# Patient Record
Sex: Female | Born: 1970 | Race: White | Hispanic: No | Marital: Married | Smoking: Never smoker
Health system: Southern US, Community
[De-identification: ages and names within clinical notes are randomized; demographics above are authoritative.]

## PROBLEM LIST (undated history)

## (undated) DIAGNOSIS — T7840XA Allergy, unspecified, initial encounter: Secondary | ICD-10-CM

## (undated) DIAGNOSIS — C73 Malignant neoplasm of thyroid gland: Secondary | ICD-10-CM

## (undated) HISTORY — DX: Malignant neoplasm of thyroid gland: C73

## (undated) HISTORY — PX: TONSILLECTOMY: SUR1361

## (undated) HISTORY — PX: CYST EXCISION: SHX5701

## (undated) HISTORY — DX: Allergy, unspecified, initial encounter: T78.40XA

---

## 2002-05-18 DIAGNOSIS — C73 Malignant neoplasm of thyroid gland: Secondary | ICD-10-CM

## 2002-05-18 HISTORY — PX: THYROIDECTOMY: SHX17

## 2002-05-18 HISTORY — DX: Malignant neoplasm of thyroid gland: C73

## 2005-04-14 ENCOUNTER — Other Ambulatory Visit: Admission: RE | Admit: 2005-04-14 | Discharge: 2005-04-14 | Payer: Self-pay | Admitting: Obstetrics and Gynecology

## 2006-01-21 ENCOUNTER — Other Ambulatory Visit: Admission: RE | Admit: 2006-01-21 | Discharge: 2006-01-21 | Payer: Self-pay | Admitting: Obstetrics and Gynecology

## 2006-03-08 ENCOUNTER — Encounter (HOSPITAL_COMMUNITY): Admission: RE | Admit: 2006-03-08 | Discharge: 2006-03-12 | Payer: Self-pay | Admitting: Internal Medicine

## 2006-12-20 ENCOUNTER — Emergency Department (HOSPITAL_COMMUNITY): Admission: EM | Admit: 2006-12-20 | Discharge: 2006-12-20 | Payer: Self-pay | Admitting: Emergency Medicine

## 2007-01-25 ENCOUNTER — Other Ambulatory Visit: Admission: RE | Admit: 2007-01-25 | Discharge: 2007-01-25 | Payer: Self-pay | Admitting: Obstetrics and Gynecology

## 2007-03-14 ENCOUNTER — Encounter (HOSPITAL_COMMUNITY): Admission: RE | Admit: 2007-03-14 | Discharge: 2007-03-18 | Payer: Self-pay | Admitting: Endocrinology

## 2007-04-01 ENCOUNTER — Ambulatory Visit (HOSPITAL_COMMUNITY): Admission: RE | Admit: 2007-04-01 | Discharge: 2007-04-01 | Payer: Self-pay | Admitting: General Surgery

## 2007-04-21 ENCOUNTER — Ambulatory Visit (HOSPITAL_COMMUNITY): Admission: RE | Admit: 2007-04-21 | Discharge: 2007-04-21 | Payer: Self-pay | Admitting: Endocrinology

## 2007-09-19 ENCOUNTER — Encounter (HOSPITAL_COMMUNITY): Admission: RE | Admit: 2007-09-19 | Discharge: 2007-09-23 | Payer: Self-pay | Admitting: Endocrinology

## 2008-02-02 ENCOUNTER — Other Ambulatory Visit: Admission: RE | Admit: 2008-02-02 | Discharge: 2008-02-02 | Payer: Self-pay | Admitting: Obstetrics and Gynecology

## 2008-08-06 ENCOUNTER — Encounter (HOSPITAL_COMMUNITY): Admission: RE | Admit: 2008-08-06 | Discharge: 2008-09-21 | Payer: Self-pay | Admitting: Endocrinology

## 2009-02-14 ENCOUNTER — Other Ambulatory Visit: Admission: RE | Admit: 2009-02-14 | Discharge: 2009-02-14 | Payer: Self-pay | Admitting: Obstetrics and Gynecology

## 2009-04-12 ENCOUNTER — Encounter: Admission: RE | Admit: 2009-04-12 | Discharge: 2009-04-12 | Payer: Self-pay | Admitting: Obstetrics and Gynecology

## 2009-10-15 ENCOUNTER — Ambulatory Visit (HOSPITAL_COMMUNITY): Admission: RE | Admit: 2009-10-15 | Discharge: 2009-10-15 | Payer: Self-pay | Admitting: Endocrinology

## 2009-10-22 ENCOUNTER — Emergency Department (HOSPITAL_COMMUNITY): Admission: EM | Admit: 2009-10-22 | Discharge: 2009-10-22 | Payer: Self-pay | Admitting: Family Medicine

## 2009-11-08 ENCOUNTER — Ambulatory Visit (HOSPITAL_COMMUNITY): Admission: RE | Admit: 2009-11-08 | Discharge: 2009-11-08 | Payer: Self-pay | Admitting: Endocrinology

## 2010-02-20 ENCOUNTER — Other Ambulatory Visit: Admission: RE | Admit: 2010-02-20 | Discharge: 2010-02-20 | Payer: Self-pay | Admitting: Obstetrics and Gynecology

## 2010-05-26 ENCOUNTER — Ambulatory Visit
Admission: RE | Admit: 2010-05-26 | Discharge: 2010-05-26 | Payer: Self-pay | Source: Home / Self Care | Attending: Family Medicine | Admitting: Family Medicine

## 2010-05-26 DIAGNOSIS — J019 Acute sinusitis, unspecified: Secondary | ICD-10-CM | POA: Insufficient documentation

## 2010-05-26 DIAGNOSIS — J309 Allergic rhinitis, unspecified: Secondary | ICD-10-CM | POA: Insufficient documentation

## 2010-06-07 ENCOUNTER — Encounter: Payer: Self-pay | Admitting: Internal Medicine

## 2010-06-08 ENCOUNTER — Encounter: Payer: Self-pay | Admitting: Endocrinology

## 2010-06-19 NOTE — Assessment & Plan Note (Signed)
Summary: [facility] employee/kh   Vital Signs:  Patient profile:   40 year old female Height:      64 inches Weight:      135 pounds BMI:     23.26 Temp:     98.7 degrees F oral Pulse rate:   97 / minute Pulse rhythm:   regular BP sitting:   142 / 95  (left arm) Cuff size:   regular  Vitals Entered By: Luretha Murphy NP (May 26, 2010 11:05 AM) CC: new pt Is Patient Diabetic? No Comments chest cold x 2 weeks   CC:  new pt.  History of Present Illness: Christy [name] comes in with 2 weeks of congestion and relentless cough.  She has been out of work, working from home, and spent several days lying on the couch.  She describes a typical ILI with aches, no fever, that followed with congestion.  She seemed to be getting better, her sinuses were draining well and over the past few days has felt a second sickening.  Her sinuses are no longer draining and she can feel the secreations rolling around.  She is coughing quite a bit, especially at night. When she brings them out they are thick and dark green.  She has been using OTC products.    She has a past history of allergic rhinitis as use to use a nasal steroid, she self discontinued some months ago as she moved to [facility] and had not set up a primary care MD.  She is followed by endo for thyroid CA, S/P treatment and on replacement, her endo keeps her a little hyperthyroid so she has worried about using OTC meds.  She is also followed by a GYN.  Habits & Providers  Alcohol-Tobacco-Diet     Tobacco Status: never  Exercise-Depression-Behavior     Have you felt down or hopeless? no     Have you felt little pleasure in things? no     Depression Counseling: not indicated; screening negative for depression     Seat Belt Use: always  Current Medications (verified): 1)  Amoxicillin 500 Mg Caps (Amoxicillin) .... One Three Times A Day For 7 Days 2)  Tussionex Pennkinetic Er 10-8 Mg/56ml Lqcr (Hydrocod Polst-Chlorphen Polst) .... One Teaspoonful Q 12  Hours, 100cc 3)  Flonase 50 Mcg/act Susp (Fluticasone Propionate) .... Two Squirts in Each Nostril Daily  Allergies (verified): No Known Drug Allergies  Past History:  Past Medical History: Thyroid cancer  Past Surgical History: Throidectomy  Social History: Smoking Status:  never Risk analyst Use:  always  Physical Exam  General:  Well-developed,well-nourished,in no acute distress; alert,appropriate and cooperative throughout examination Ears:  TMs retracted Nose:  good air movement Mouth:  + post nasal drainage Lungs:  Normal respiratory effort, chest expands symmetrically. Lungs are clear to auscultation, no crackles or wheezes. Heart:  rate 97 and regular rhythm.   Skin:  no rashes   Impression & Recommendations:  Problem # 1:  SINUSITIS, ACUTE (ICD-461.9) Treat for secondary infection, cough suppression at night with narcotic cough syrup, add nasal spray both saline and steroid Her updated medication list for this problem includes:    Amoxicillin 500 Mg Caps (Amoxicillin) ..... One three times a day for 7 days    Tussionex Pennkinetic Er 10-8 Mg/13ml Lqcr (Hydrocod polst-chlorphen polst) ..... One teaspoonful q 12 hours, 100cc    Flonase 50 Mcg/act Susp (Fluticasone propionate) .Marland Kitchen..Marland Kitchen Two squirts in each nostril daily  Orders: [facility]- Est Level  3 ([address]109)  Problem # 2:  ALLERGIC RHINITIS (ICD-477.9) recommend continueing beyond the acute period, she can return to see me or another provider at any time. Her updated medication list for this problem includes:    Flonase 50 Mcg/act Susp (Fluticasone propionate) .Marland Kitchen..Marland Kitchen Two squirts in each nostril daily  Orders: [facility]- Est Level  3 ([address]454)  Complete Medication List: 1)  Amoxicillin 500 Mg Caps (Amoxicillin) .... One three times a day for 7 days 2)  Tussionex Pennkinetic Er 10-8 Mg/61ml Lqcr (Hydrocod polst-chlorphen polst) .... One teaspoonful q 12 hours, 100cc 3)  Flonase 50 Mcg/act Susp (Fluticasone propionate) .... Two  squirts in each nostril daily  Patient Instructions: 1)  Fluids 2)  Nasal saline 3)  Antibiotics 4)  Narcotic cough supp at night 5)  nasal steroids Prescriptions: FLONASE 50 MCG/ACT SUSP (FLUTICASONE PROPIONATE) two squirts in each nostril daily  #1 x 11   Entered and Authorized by:   Luretha Murphy NP   Signed by:   Luretha Murphy NP on 05/26/2010   Method used:   Electronically to        [facility]* (retail)       [address]       [address]mailing       [place], [place]  [address]       Ph: [phone]       Fax: [phone]   RxID:   [id number] Sandria Senter ER 10-8 MG/5ML LQCR (HYDROCOD POLST-CHLORPHEN POLST) one teaspoonful q 12 hours, 100cc  #1 x 0   Entered and Authorized by:   Luretha Murphy NP   Signed by:   Luretha Murphy NP on 05/26/2010   Method used:   Print then Give to Patient   RxID:   [id number] AMOXICILLIN 500 MG CAPS (AMOXICILLIN) one three times a day for 7 days  #21 x 0   Entered and Authorized by:   Luretha Murphy NP   Signed by:   Luretha Murphy NP on 05/26/2010   Method used:   Electronically to        [facility]* (retail)       [address]       [address]mail[address]       [place], [place]  [address]       Ph: [phone]       Fax: [phone]   RxID:   [id number]    Orders Added: 1)  [facility]- Est Level  3 [[address]27]

## 2010-08-03 LAB — CBC
Platelets: 253 10*3/uL (ref 150–400)
RDW: 12.4 % (ref 11.5–15.5)
WBC: 8.8 10*3/uL (ref 4.0–10.5)

## 2010-08-03 LAB — APTT: aPTT: 29 s (ref 24–37)

## 2010-08-03 LAB — PROTIME-INR: INR: 0.99 (ref 0.00–1.49)

## 2010-08-19 ENCOUNTER — Emergency Department (HOSPITAL_BASED_OUTPATIENT_CLINIC_OR_DEPARTMENT_OTHER)
Admission: EM | Admit: 2010-08-19 | Discharge: 2010-08-19 | Disposition: A | Discharge status: Home / Self Care | Payer: 59 | Attending: Emergency Medicine | Admitting: Emergency Medicine

## 2010-08-19 ENCOUNTER — Emergency Department (INDEPENDENT_AMBULATORY_CARE_PROVIDER_SITE_OTHER): Payer: 59

## 2010-08-19 DIAGNOSIS — W292XXA Contact with other powered household machinery, initial encounter: Secondary | ICD-10-CM | POA: Insufficient documentation

## 2010-08-19 DIAGNOSIS — S61209A Unspecified open wound of unspecified finger without damage to nail, initial encounter: Secondary | ICD-10-CM | POA: Insufficient documentation

## 2010-08-19 DIAGNOSIS — Y92009 Unspecified place in unspecified non-institutional (private) residence as the place of occurrence of the external cause: Secondary | ICD-10-CM | POA: Insufficient documentation

## 2010-09-05 ENCOUNTER — Other Ambulatory Visit (HOSPITAL_COMMUNITY): Payer: Self-pay | Admitting: Endocrinology

## 2010-09-05 DIAGNOSIS — T17308A Unspecified foreign body in larynx causing other injury, initial encounter: Secondary | ICD-10-CM

## 2010-09-05 DIAGNOSIS — C73 Malignant neoplasm of thyroid gland: Secondary | ICD-10-CM

## 2010-09-10 ENCOUNTER — Other Ambulatory Visit (HOSPITAL_COMMUNITY): Payer: Self-pay | Admitting: Endocrinology

## 2010-09-10 DIAGNOSIS — C73 Malignant neoplasm of thyroid gland: Secondary | ICD-10-CM

## 2010-09-11 ENCOUNTER — Ambulatory Visit (HOSPITAL_COMMUNITY)
Admission: RE | Admit: 2010-09-11 | Discharge: 2010-09-11 | Disposition: A | Discharge status: Home / Self Care | Payer: 59 | Source: Ambulatory Visit | Attending: Endocrinology | Admitting: Endocrinology

## 2010-09-11 DIAGNOSIS — C73 Malignant neoplasm of thyroid gland: Secondary | ICD-10-CM

## 2010-09-11 DIAGNOSIS — J3800 Paralysis of vocal cords and larynx, unspecified: Secondary | ICD-10-CM | POA: Insufficient documentation

## 2010-09-11 DIAGNOSIS — R6889 Other general symptoms and signs: Secondary | ICD-10-CM | POA: Insufficient documentation

## 2010-09-11 DIAGNOSIS — T17308A Unspecified foreign body in larynx causing other injury, initial encounter: Secondary | ICD-10-CM

## 2010-09-11 DIAGNOSIS — R059 Cough, unspecified: Secondary | ICD-10-CM | POA: Insufficient documentation

## 2010-09-11 DIAGNOSIS — R05 Cough: Secondary | ICD-10-CM | POA: Insufficient documentation

## 2010-09-22 ENCOUNTER — Encounter (HOSPITAL_COMMUNITY)
Admission: RE | Admit: 2010-09-22 | Discharge: 2010-09-22 | Disposition: A | Discharge status: Home / Self Care | Payer: 59 | Source: Ambulatory Visit | Attending: Endocrinology | Admitting: Endocrinology

## 2010-09-22 DIAGNOSIS — C73 Malignant neoplasm of thyroid gland: Secondary | ICD-10-CM | POA: Insufficient documentation

## 2010-09-23 ENCOUNTER — Encounter (HOSPITAL_COMMUNITY)
Admission: RE | Admit: 2010-09-23 | Discharge: 2010-09-23 | Disposition: A | Discharge status: Home / Self Care | Payer: 59 | Source: Ambulatory Visit | Attending: Endocrinology | Admitting: Endocrinology

## 2010-09-24 ENCOUNTER — Encounter (HOSPITAL_COMMUNITY)
Admission: RE | Admit: 2010-09-24 | Discharge: 2010-09-24 | Disposition: A | Discharge status: Home / Self Care | Payer: 59 | Source: Ambulatory Visit | Attending: Endocrinology | Admitting: Endocrinology

## 2010-09-24 DIAGNOSIS — C73 Malignant neoplasm of thyroid gland: Secondary | ICD-10-CM | POA: Insufficient documentation

## 2010-09-26 ENCOUNTER — Ambulatory Visit (HOSPITAL_COMMUNITY)
Admission: RE | Admit: 2010-09-26 | Discharge: 2010-09-26 | Disposition: A | Discharge status: Home / Self Care | Payer: 59 | Source: Ambulatory Visit | Attending: Endocrinology | Admitting: Endocrinology

## 2010-09-26 DIAGNOSIS — C73 Malignant neoplasm of thyroid gland: Secondary | ICD-10-CM | POA: Insufficient documentation

## 2010-09-26 MED ORDER — SODIUM IODIDE I 131 CAPSULE
4.0000 | Freq: Once | INTRAVENOUS | Status: AC | PRN
  Administered 2010-09-26: 4 via ORAL

## 2011-08-03 ENCOUNTER — Other Ambulatory Visit: Payer: Self-pay | Admitting: Obstetrics and Gynecology

## 2011-08-03 DIAGNOSIS — Z1231 Encounter for screening mammogram for malignant neoplasm of breast: Secondary | ICD-10-CM

## 2011-08-06 ENCOUNTER — Ambulatory Visit
Admission: RE | Admit: 2011-08-06 | Discharge: 2011-08-06 | Disposition: A | Discharge status: Home / Self Care | Payer: 59 | Source: Ambulatory Visit | Attending: Obstetrics and Gynecology | Admitting: Obstetrics and Gynecology

## 2011-08-06 DIAGNOSIS — Z1231 Encounter for screening mammogram for malignant neoplasm of breast: Secondary | ICD-10-CM

## 2012-04-19 ENCOUNTER — Encounter: Payer: Self-pay | Admitting: Family Medicine

## 2012-04-19 ENCOUNTER — Ambulatory Visit (INDEPENDENT_AMBULATORY_CARE_PROVIDER_SITE_OTHER): Payer: 59 | Admitting: Family Medicine

## 2012-04-19 VITALS — BP 138/86 | HR 94 | Temp 98.6°F | Ht 64.0 in | Wt 137.0 lb

## 2012-04-19 DIAGNOSIS — J189 Pneumonia, unspecified organism: Secondary | ICD-10-CM

## 2012-04-19 MED ORDER — AZITHROMYCIN 500 MG PO TABS: 500.0000 mg | ORAL_TABLET | Freq: Every day | ORAL | Status: AC

## 2012-04-19 MED ORDER — BENZONATATE 200 MG PO CAPS: 200.0000 mg | ORAL_CAPSULE | Freq: Two times a day (BID) | ORAL | Status: DC | PRN

## 2012-04-19 NOTE — Assessment & Plan Note (Signed)
For your symptoms: Most likely viral in nature, but I am concerned for a bacterial component, atypical.   For this: 1. Azithromycin 500 mg daily for 5 days 2. Tessalon for cough 3. Continue mucinex, honey and plenty of fluids.   F/u for fever, worsening shortness of breath and new onset chest pain.  If patient is not better will obtain CXR and consider broadening antibiotics.

## 2012-04-19 NOTE — Patient Instructions (Addendum)
Christy [name],   Thank you for coming in today.   For your symptoms: Most likely viral in nature, but I am concerned for a bacterial component.   For this: 1. Azithromycin 500 mg daily for 5 days 2. Tessalon for cough 3. Continue mucinex, honey and plenty of fluids.   F/u for fever, worsening shortness of breath and new onset chest pain.   Dr. Armen Pickup

## 2012-04-19 NOTE — Progress Notes (Signed)
Subjective:     Patient ID: Christy [name], female   DOB: 07-31-1970, 41 y.o.   MRN: [id number]  HPI 41 yo F non-smoker presents for same day visit with 10 days of cough and fatigue. Cough was initially non-productive but is now productive. Additionally she experienced scratchy throat, loss of voice, nasal congestion and fatigue. She reports that cough and lack of sleep are the worse symptoms. She denies fever and chest pain. She has SOB after coughing fits. Sick contacts at work. Husband is not sick.   Review of Systems As per HPI     Objective:   Physical Exam BP 138/86  Pulse 94  Temp 98.6 F (37 C) (Oral)  Ht 5\' 4"  (1.626 m)  Wt 137 lb (62.143 kg)  BMI 23.52 kg/m2  SpO2 97% General appearance: alert, cooperative and no distress Eyes: conjunctivae/corneas clear. PERRL, EOM's intact.  Nose: moderate congestion, turbinates pink, swollen Throat: lips, mucosa, and tongue normal; teeth and gums normal Neck: no adenopathy, no carotid bruit, no JVD, supple, symmetrical, trachea midline and thyroid absent with healed surgical scar.  Lungs: clear to auscultation bilaterally Heart: regular rate and rhythm, S1, S2 normal, no murmur, click, rub or gallop Pulses: 2+ and symmetric Neurologic: Grossly normal     Assessment and Plan:

## 2012-04-20 ENCOUNTER — Encounter: Payer: Self-pay | Admitting: Family Medicine

## 2012-04-20 ENCOUNTER — Telehealth: Payer: Self-pay | Admitting: Family Medicine

## 2012-04-20 NOTE — Telephone Encounter (Signed)
Tessalon pearls not working - needs something else call in today if possible - she was up all night coughing,.

## 2012-04-20 NOTE — Telephone Encounter (Signed)
Will forward message to Dr. Armen Pickup. Patient notified that it will be tomorrow  .

## 2012-04-21 MED ORDER — HYDROCODONE-HOMATROPINE 5-1.5 MG/5ML PO SYRP: 5.0000 mL | ORAL_SOLUTION | Freq: Three times a day (TID) | ORAL | Status: DC | PRN

## 2012-04-21 NOTE — Telephone Encounter (Signed)
I have  paged Dr. Armen Pickup twice today but she is unavailable. Will send message to preceptor for advise.

## 2012-04-21 NOTE — Telephone Encounter (Signed)
Patient is calling because she really would like some cough medication.  She hasn't slept in several days and has now pulled a muscle in her neck from her coughing.  She uses [facility].  She said if she isn't at her desk, it is ok to leave a message.

## 2012-04-21 NOTE — Telephone Encounter (Signed)
Rx called in to [facility]. and patient notified.

## 2012-06-15 ENCOUNTER — Ambulatory Visit: Payer: 59 | Admitting: Family Medicine

## 2012-06-17 ENCOUNTER — Encounter: Payer: Self-pay | Admitting: Family Medicine

## 2012-06-17 ENCOUNTER — Ambulatory Visit (INDEPENDENT_AMBULATORY_CARE_PROVIDER_SITE_OTHER): Payer: 59 | Admitting: Family Medicine

## 2012-06-17 VITALS — BP 129/84 | HR 96 | Temp 98.8°F | Ht 64.0 in | Wt 140.0 lb

## 2012-06-17 DIAGNOSIS — Z23 Encounter for immunization: Secondary | ICD-10-CM

## 2012-06-17 DIAGNOSIS — J309 Allergic rhinitis, unspecified: Secondary | ICD-10-CM

## 2012-06-17 MED ORDER — CETIRIZINE HCL 10 MG PO TABS: 10.0000 mg | ORAL_TABLET | Freq: Every day | ORAL | Status: AC

## 2012-06-17 MED ORDER — FLUTICASONE PROPIONATE 50 MCG/ACT NA SUSP: 2.0000 | Freq: Every day | NASAL | Status: DC

## 2012-06-17 NOTE — Progress Notes (Signed)
  Subjective:    Patient ID: Christy [name], female    DOB: [birth year], 42 y.o.   MRN: [id number]  HPI Comments: Has long h/o allergies, but does not remember to take her meds as directed.  Has new dog at home x 1 year and she is getting worse with this.  She uses Claritin and Benadryl sometimes.  Sinus Problem This is a chronic problem. The current episode started more than 1 year ago. The problem has been gradually worsening since onset. There has been no fever. Associated symptoms include congestion, sinus pressure and sneezing. Pertinent negatives include no chills, coughing, shortness of breath, sore throat or swollen glands. Treatments tried: anti-histamines prn. The treatment provided mild relief.      Review of Systems  Constitutional: Negative for chills.  HENT: Positive for congestion, sneezing and sinus pressure. Negative for sore throat.   Respiratory: Negative for cough and shortness of breath.        Objective:   Physical Exam  Vitals reviewed. Constitutional: She appears well-developed and well-nourished.  HENT:  Head: Normocephalic and atraumatic.  Right Ear: Tympanic membrane normal.  Left Ear: Tympanic membrane normal.  Nose: Mucosal edema and rhinorrhea present. No nose lacerations or sinus tenderness. Right sinus exhibits no frontal sinus tenderness. Left sinus exhibits no frontal sinus tenderness.  Mouth/Throat: Oropharynx is clear and moist.  Neck: Neck supple.  Cardiovascular: Normal rate and regular rhythm.   No murmur heard. Pulmonary/Chest: Effort normal and breath sounds normal.  Abdominal: Soft. There is no tenderness. There is no guarding.  Lymphadenopathy:    She has no cervical adenopathy.          Assessment & Plan:  TDaP today Health screen at work and flu shot there in 10/13.

## 2012-06-17 NOTE — Assessment & Plan Note (Signed)
Trial of steroid nasal spray and Zyrtec.  Needs to try to take daily.

## 2012-06-17 NOTE — Patient Instructions (Addendum)
Allergic Rhinitis  Allergic rhinitis is when the mucous membranes in the nose respond to allergens. Allergens are particles in the air that cause your body to have an allergic reaction. This causes you to release allergic antibodies. Through a chain of events, these eventually cause you to release histamine into the blood stream (hence the use of antihistamines). Although meant to be protective to the body, it is this release that causes your discomfort, such as frequent sneezing, congestion and an itchy runny nose.    CAUSES    The pollen allergens may come from grasses, trees, and weeds. This is seasonal allergic rhinitis, or "hay fever." Other allergens cause year-round allergic rhinitis (perennial allergic rhinitis) such as house dust mite allergen, pet dander and mold spores.    SYMPTOMS     Nasal stuffiness (congestion).   Runny, itchy nose with sneezing and tearing of the eyes.   There is often an itching of the mouth, eyes and ears.  It cannot be cured, but it can be controlled with medications.  DIAGNOSIS    If you are unable to determine the offending allergen, skin or blood testing may find it.  TREATMENT     Avoid the allergen.   Medications and allergy shots (immunotherapy) can help.   Hay fever may often be treated with antihistamines in pill or nasal spray forms. Antihistamines block the effects of histamine. There are over-the-counter medicines that may help with nasal congestion and swelling around the eyes. Check with your caregiver before taking or giving this medicine.  If the treatment above does not work, there are many new medications your caregiver can prescribe. Stronger medications may be used if initial measures are ineffective. Desensitizing injections can be used if medications and avoidance fails. Desensitization is when a patient is given ongoing shots until the body becomes less sensitive to the allergen. Make sure you follow up with your caregiver if problems continue.   SEEK MEDICAL CARE IF:     You develop fever (more than 100.5 F (38.1 C).   You develop a cough that does not stop easily (persistent).   You have shortness of breath.   You start wheezing.   Symptoms interfere with normal daily activities.  Document Released: 01/27/2001 Document Revised: 07/27/2011 Document Reviewed: 08/08/2008  ExitCare Patient Information 2013 [employer].

## 2012-07-02 ENCOUNTER — Other Ambulatory Visit: Payer: Self-pay

## 2012-09-16 ENCOUNTER — Other Ambulatory Visit (HOSPITAL_COMMUNITY): Payer: Self-pay | Admitting: Endocrinology

## 2012-09-16 DIAGNOSIS — C73 Malignant neoplasm of thyroid gland: Secondary | ICD-10-CM

## 2012-09-29 ENCOUNTER — Ambulatory Visit (INDEPENDENT_AMBULATORY_CARE_PROVIDER_SITE_OTHER): Payer: 59 | Admitting: Sports Medicine

## 2012-09-29 ENCOUNTER — Encounter: Payer: Self-pay | Admitting: Sports Medicine

## 2012-09-29 VITALS — BP 133/98 | HR 82 | Ht 64.0 in | Wt 140.0 lb

## 2012-09-29 DIAGNOSIS — M7662 Achilles tendinitis, left leg: Secondary | ICD-10-CM

## 2012-09-29 DIAGNOSIS — M7660 Achilles tendinitis, unspecified leg: Secondary | ICD-10-CM

## 2012-09-29 NOTE — Patient Instructions (Signed)
Try heel lifts in your shoes to take pressure of  Please do suggested exercises daily

## 2012-09-29 NOTE — Progress Notes (Signed)
  Subjective:    Patient ID: Christy [name], female    DOB: 02/11/1971, 42 y.o.   MRN: [id number]  HPI  Lt AT pain since yesterday, stepped out of car felt pain immediately. Has had aching since that time, more painful with weight bearing. On synthroid for suppression therapy s/p thyroid CA x 10 years. Went to [place] last week, did walking on hard sand on beach and some biking.   No history of use of Cipro  She walks her dog regularly but is not in an exercise program   Review of Systems     Objective:   Physical Exam  Physical exam:  Rt AT: Non tender Normal ROM Slight increase in inversion laxity on rt  Lt AT: Tenderness deep to achilles in bursal area Calf non tender Normal ROM  Slight subluxation of 5th MTP bilat Flattening of trans arch bilat, otherwise normal  Rt SI joint does not move freely Pseudo leg length difference that corrects with sitting up  MSK ultrasound Left Achilles tendon is intact in all the fibers appear normal The left retrocalcaneal bursa is hypoechoic and somewhat swollen This is seen on both longitudinal and transverse view No significant increase in Doppler flow       Assessment & Plan:

## 2012-09-29 NOTE — Assessment & Plan Note (Signed)
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

## 2012-10-03 ENCOUNTER — Encounter (HOSPITAL_COMMUNITY)
Admission: RE | Admit: 2012-10-03 | Discharge: 2012-10-03 | Disposition: A | Discharge status: Home / Self Care | Payer: 59 | Source: Ambulatory Visit | Attending: Endocrinology | Admitting: Endocrinology

## 2012-10-03 DIAGNOSIS — C73 Malignant neoplasm of thyroid gland: Secondary | ICD-10-CM | POA: Insufficient documentation

## 2012-10-03 DIAGNOSIS — E0789 Other specified disorders of thyroid: Secondary | ICD-10-CM | POA: Insufficient documentation

## 2012-10-03 MED ORDER — THYROTROPIN ALFA 1.1 MG IM SOLR
0.9000 mg | INTRAMUSCULAR | Status: AC
  Administered 2012-10-03: 0.9 mg via INTRAMUSCULAR

## 2012-10-04 ENCOUNTER — Encounter (HOSPITAL_COMMUNITY)
Admission: RE | Admit: 2012-10-04 | Discharge: 2012-10-04 | Disposition: A | Discharge status: Home / Self Care | Payer: 59 | Source: Ambulatory Visit | Attending: Endocrinology | Admitting: Endocrinology

## 2012-10-04 MED ORDER — THYROTROPIN ALFA 1.1 MG IM SOLR
0.9000 mg | INTRAMUSCULAR | Status: AC
  Administered 2012-10-04: 0.9 mg via INTRAMUSCULAR

## 2012-10-05 ENCOUNTER — Encounter (HOSPITAL_COMMUNITY)
Admission: RE | Admit: 2012-10-05 | Discharge: 2012-10-05 | Disposition: A | Discharge status: Home / Self Care | Payer: 59 | Source: Ambulatory Visit | Attending: Endocrinology | Admitting: Endocrinology

## 2012-10-05 MED ORDER — SODIUM IODIDE I 131 CAPSULE
4.0000 | Freq: Once | INTRAVENOUS | Status: AC | PRN
  Administered 2012-10-05: 4 via ORAL

## 2012-10-07 ENCOUNTER — Encounter (HOSPITAL_COMMUNITY)
Admission: RE | Admit: 2012-10-07 | Discharge: 2012-10-07 | Disposition: A | Discharge status: Home / Self Care | Payer: 59 | Source: Ambulatory Visit | Attending: Endocrinology | Admitting: Endocrinology

## 2012-10-07 MED ORDER — SODIUM IODIDE I 131 CAPSULE
4.0000 | Freq: Once | INTRAVENOUS | Status: AC | PRN
  Administered 2012-10-07: 4 via ORAL

## 2012-10-14 ENCOUNTER — Ambulatory Visit (INDEPENDENT_AMBULATORY_CARE_PROVIDER_SITE_OTHER): Payer: 59 | Admitting: Family Medicine

## 2012-10-14 ENCOUNTER — Encounter: Payer: Self-pay | Admitting: Family Medicine

## 2012-10-14 VITALS — BP 115/87 | HR 95 | Temp 99.3°F | Ht 65.0 in | Wt 136.5 lb

## 2012-10-14 DIAGNOSIS — L989 Disorder of the skin and subcutaneous tissue, unspecified: Secondary | ICD-10-CM

## 2012-10-14 MED ORDER — TRIAMCINOLONE ACETONIDE 0.1 % EX CREA: TOPICAL_CREAM | Freq: Two times a day (BID) | CUTANEOUS | Status: DC

## 2012-10-14 NOTE — Patient Instructions (Addendum)
Christy [name],  Thank you for coming in today. Please do a trial of topical corticosteroid cream. I have prescribed kenalog but I recommend trying OTC hydrocortisone first as it is less potent.  Twice daily for two weeks. Stop is you notice skin lightening or worsening redness/ spreading in the area. If you do not see improvement in 4 weeks I recommend a biopsy of the area.   Dr. Armen Pickup

## 2012-10-14 NOTE — Progress Notes (Signed)
Subjective:     Patient ID: Christy [name], female   DOB: Sep 27, 1970, 42 y.o.   MRN: [id number]  HPI 42 year old female presents to evaluate lesion on her abdomen. She believes that she sustained a bug bite abdomen 4 weeks ago which was traveling back from [place]. She is in [place] for infiltrating. She reports that she did not spend much time outside. She said the area was red and somewhat fluctuant but not pruritic. For the past 4 weeks he feels that there is a little bit smaller but has become indurated. She tried tea tree oil with no change in the appearance. She denies associated fever, chills, night sweats, muscle aches or joint pains. She has no lesions on any other part of her body.  Review of Systems As per history of present illness    Objective:   Physical Exam BP 115/87  Pulse 95  Temp(Src) 99.3 F (37.4 C) (Oral)  Ht 5\' 5"  (1.651 m)  Wt 136 lb 8 oz (61.916 kg)  BMI 22.71 kg/m2  LMP 10/03/2012 General appearance: alert, cooperative and no distress Skin: Skin color, texture, turgor normal. No rashes or lesions except for an raised, shiny, blanching, erythematous papule on upper abdomen. There area is 12 x 9 mm in size. The area is indurated. There is no fluctuance.     Assessment and Plan:

## 2012-10-14 NOTE — Assessment & Plan Note (Signed)
A: suspects reactive papule from bug bite. Acute onset is not consistent with malignancy, however I have considered basal cell carcinoma give appearance.  P: Steroid cream to relieve mild irritation F/u in 4 weeks. Will biopsy the lesion if there is not significant improvement upon follow up.

## 2012-10-17 ENCOUNTER — Ambulatory Visit: Payer: 59 | Admitting: Family Medicine

## 2013-03-02 ENCOUNTER — Other Ambulatory Visit: Payer: Self-pay | Admitting: Nurse Practitioner

## 2013-03-02 ENCOUNTER — Other Ambulatory Visit (HOSPITAL_COMMUNITY)
Admission: RE | Admit: 2013-03-02 | Discharge: 2013-03-02 | Disposition: A | Discharge status: Home / Self Care | Payer: 59 | Source: Ambulatory Visit | Attending: Nurse Practitioner | Admitting: Nurse Practitioner

## 2013-03-02 DIAGNOSIS — Z01419 Encounter for gynecological examination (general) (routine) without abnormal findings: Secondary | ICD-10-CM | POA: Insufficient documentation

## 2013-03-02 DIAGNOSIS — Z1151 Encounter for screening for human papillomavirus (HPV): Secondary | ICD-10-CM | POA: Insufficient documentation

## 2013-03-23 ENCOUNTER — Other Ambulatory Visit: Payer: Self-pay

## 2013-04-21 ENCOUNTER — Encounter: Payer: Self-pay | Admitting: Family Medicine

## 2013-05-25 ENCOUNTER — Other Ambulatory Visit: Payer: Self-pay

## 2013-05-25 DIAGNOSIS — Z1231 Encounter for screening mammogram for malignant neoplasm of breast: Secondary | ICD-10-CM

## 2013-06-15 ENCOUNTER — Ambulatory Visit
Admission: RE | Admit: 2013-06-15 | Discharge: 2013-06-15 | Disposition: A | Discharge status: Home / Self Care | Payer: 59 | Source: Ambulatory Visit

## 2013-06-15 DIAGNOSIS — Z1231 Encounter for screening mammogram for malignant neoplasm of breast: Secondary | ICD-10-CM

## 2013-10-02 ENCOUNTER — Ambulatory Visit (INDEPENDENT_AMBULATORY_CARE_PROVIDER_SITE_OTHER): Payer: 59 | Admitting: Internal Medicine

## 2013-10-02 VITALS — BP 108/70 | HR 86 | Temp 98.4°F | Resp 12 | Ht 65.0 in | Wt 134.8 lb

## 2013-10-02 DIAGNOSIS — E032 Hypothyroidism due to medicaments and other exogenous substances: Secondary | ICD-10-CM

## 2013-10-02 DIAGNOSIS — C73 Malignant neoplasm of thyroid gland: Secondary | ICD-10-CM

## 2013-10-02 DIAGNOSIS — E890 Postprocedural hypothyroidism: Secondary | ICD-10-CM | POA: Insufficient documentation

## 2013-10-02 LAB — TSH: TSH: 0.96 u[IU]/mL (ref 0.35–4.50)

## 2013-10-02 LAB — T4, FREE: FREE T4: 1.52 ng/dL (ref 0.60–1.60)

## 2013-10-02 NOTE — Progress Notes (Signed)
Patient ID: Christy [name], female   DOB: 23-Feb-1971, 43 y.o.   MRN: [id number]   HPI  Christy [name] is a 44 y.o.-year-old female, referred by her PCP, Langston Masker, MD, for management of follicular thyroid cancer, in remission. She was seen by Dr Chalmers Cater in the past.  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 Almetta Lovely note, stimulated Tg was 2.9 08/10/2008 Thyrogen stimulated WBS negative 10/15/2009 ultrasound neck with 3 abnormal cervical lymph nodes 11/08/2009 biopsy of one of the lymph node that was found to be without fatty hilum: negative for metastases 09/26/2010 Thyrogen stimulated WBS negative except uptake in colon 10/07/2012 Thyrogen stimulated WBS negative ! Patient has a Thyrogen allergy >> needs pretreatment with steroids  Per Dr. Almetta Lovely note, patient had many WBS's and a PET scan in the past as she had positive stimulated thyroglobulin, without an identified Thyroid tissue focus.  I reviewed pt's thyroid tests per records from Dr Chalmers Cater.: 06/28/2013: TSH 1.1  10/07/2012: Tg 2.0, ATA <1 09/16/2012: TSH 0.644, Tg 0.1, ATA <1 2013: Tg <0.5, ATA <20 12/2007: Tg <0.5, TSH 0.1 03/2007: Tg <0.5, Stimulated Tg 2.7 07/14/2006: Tg 1, ATA 3.6  Pt. is on Levothyroxine 125 mcg, taken: - fasting - with water - separated by >45 min from b'fast  - no calcium, iron, PPIs, multivitamins   Pt denies feeling nodules in neck, hoarseness, dysphagia/odynophagia, SOB with lying down.  Pt denies: - cold intolerance - weight gain - fatigue - constipation - dry skin - hair  falling - depression/anxiety  She has no FH of thyroid disorders. No FH of thyroid cancer.  No h/o radiation tx to head or neck except RAI tx.  ROS: Constitutional: no weight gain/loss, no fatigue, no subjective hyperthermia/hypothermia, occasional poor sleep Eyes: no blurry vision, no xerophthalmia ENT: no sore throat, + nodules palpated in throat, no dysphagia/odynophagia, no hoarseness Cardiovascular: no CP/SOB/palpitations/leg swelling Respiratory: no cough/SOB Gastrointestinal: no N/V/D/C Musculoskeletal: no muscle/joint aches (jaw pain) Skin: no rashes Neurological: no tremors/numbness/tingling/dizziness Psychiatric: no depression/anxiety + menstrual cycle pbs when not on OCP  Past Medical History  Diagnosis Date  . Thyroid cancer 2004    s/p thyroidectomy    History   Social History  . Marital Status: Married    Spouse Name: N/A    Number of Children: 2   Occupational History  . Systems analyst - [employer]   Social History Main Topics  . Smoking status: Never Smoker   . Smokeless tobacco: No  . Alcohol Use: Occasional: wine, beer  . Drug Use: No  Exercise: walk/run - daily   Current Outpatient Prescriptions on File Prior to Visit  Medication Sig Dispense Refill  . cetirizine (ZYRTEC) 10 MG tablet Take 1 tablet (10 mg total) by mouth daily.  30 tablet  11  . fluticasone (FLONASE) 50 MCG/ACT nasal spray Place 2 sprays into the nose daily.  16 g  6  . levothyroxine (SYNTHROID, LEVOTHROID) 125 MCG tablet Take 125 mcg by mouth daily. Prescribed and monitored by Dr. [facility]      . Melatonin 3 MG TABS Take by mouth.      . etonogestrel-ethinyl estradiol (NUVARING) 0.12-0.015 MG/24HR  vaginal ring Place 1 each vaginally every 28 (twenty-eight) days. Insert vaginally and leave in place for 3 consecutive weeks, then remove for 1 week.      . triamcinolone cream (KENALOG) 0.1 % Apply topically 2 (two) times daily.  30 g  0   No current  facility-administered medications on file prior to visit.   Allergies  Allergen Reactions  . Neosporin [Neomycin-Bacitracin Zn-Polymyx]    FH: H/o heart ds. in father  PE: BP 108/70  Pulse 86  Temp(Src) 98.4 F (36.9 C) (Oral)  Resp 12  Ht 5\' 5"  (1.651 m)  Wt 134 lb 12.8 oz (61.145 kg)  BMI 22.43 kg/m2  SpO2 96% Wt Readings from Last 3 Encounters:  10/14/12 136 lb 8 oz (61.916 kg)  09/29/12 140 lb (63.504 kg)  06/17/12 140 lb (63.504 kg)   Constitutional: normal weight, in NAD Eyes: PERRLA, EOMI, no exophthalmos ENT: moist mucous membranes, no thyroid nodules palpable, cervical scar healed, no cervical lymphadenopathy Cardiovascular: RRR, No MRG Respiratory: CTA B Gastrointestinal: abdomen soft, NT, ND, BS+ Musculoskeletal: no deformities, strength intact in all 4 Skin: moist, warm, no rashes Neurological: no tremor with outstretched hands, DTR normal in all 4  ASSESSMENT: 1. Follicular thyroid cancer  2. Iatrogenic Hypothyroidism  PLAN:  1. Patient with h/o ThyCa in remission and also with postsurgical hypothyroidism, on levothyroxine therapy. She appears euthyroid. - We discussed about correct intake of levothyroxine, fasting, with water, separated by at least 30 minutes from breakfast, and separated by more than 4 hours from calcium, iron, multivitamins, acid reflux medications (PPIs). - She does not appear to have a goiter, thyroid nodules, or neck compression symptoms - we'll check thyroid tests today: TSH, free T4, and will add a Tg + ATA ([facility]) - If Tg detectable, will check a neck U/S - target TSH for her is now the normal range - we asked for records from Dr Chalmers Cater - pending - I will see her back in 1 year  Office Visit on 10/02/2013  Component Date Value Ref Range Status  . TSH 10/02/2013 0.96  0.35 - 4.50 uIU/mL Final  . Free T4 10/02/2013 1.52  0.60 - 1.60 ng/dL Final  . Antithyroglobulin Ab 10/02/2013 <1.0  0.0 - 0.9 IU/mL Final   Antithyroglobulin  antibodies measured by [facility] Methodology  . THYROGLOBULIN, SERUM 10/02/2013 0.2* 1.5 - 38.5 ng/mL Final   Comment: According to the [facility] of Clinical Biochemistry, the                          reference interval for Thyroglobulin (TG) should be related to                          euthyroid patients and not for patients who underwent thyroidectomy.                          TG reference intervals for these patients depend on the residual                          mass of the thyroid tissue left after surgery. Establishing a                          post-operative baseline is recommended.  The assay limit of quantitation is 0.1 ng/mL                          Thyroglobulin measured by [employer] Coul[employer] Immunometric Assay   Will check a thyroid U/S.  CLINICAL DATA: History of thyroidectomy for thyroid carcinoma.  EXAM: ULTRASOUND OF HEAD/NECK SOFT TISSUES  TECHNIQUE: Ultrasound examination of the head and neck soft tissues was performed in the area of clinical concern.  COMPARISON: I 131 total body scan on 10/07/2012 and prior ultrasound on 10/15/2009.  FINDINGS: Small area of irregular tissue in the left thyroid bed measures roughly 1 cm in length and 0.5 cm in width. This is very ill-defined appearing and may represent scar tissue. Thyroid carcinoma recurrence cannot be completely excluded. Prior whole body nuclear medicine study was negative for any thyroid activity in the neck. No abnormal tissue is identified in the right thyroid bed. No abnormal lymph nodes are identified.  IMPRESSION: Subtle area of ill-defined soft tissue in the left thyroid bed. Thyroid carcinoma recurrence is not completely excluded. Consider correlation with repeat I 131 nuclear medicine scanning.   Electronically Signed By: Aletta Edouard M.D. On: 10/16/2013 11:10  Per review of records from Dr Chalmers Cater, she had the above thyroid remnant in the past >> that was  the reason for the many WBS's and the PET that she had over the years. Since Tg very low and no abnormal LNs >> Will continue to follow for now.

## 2013-10-02 NOTE — Patient Instructions (Signed)
Please stop at the lab.  Please return in 1 year.  

## 2013-10-04 LAB — THYROGLOBULIN, SERUM: THYROGLOBULIN, SERUM: 0.2 ng/mL — ABNORMAL LOW (ref 1.5–38.5)

## 2013-10-04 LAB — THYROGLOBULIN WITH ANTI-TG AB

## 2013-10-05 ENCOUNTER — Other Ambulatory Visit: Payer: Self-pay | Admitting: *Deleted

## 2013-10-05 ENCOUNTER — Encounter: Payer: Self-pay | Admitting: Internal Medicine

## 2013-10-05 MED ORDER — LEVOTHYROXINE SODIUM 125 MCG PO TABS: 125.0000 ug | ORAL_TABLET | Freq: Every day | ORAL | Status: DC

## 2013-10-05 NOTE — Telephone Encounter (Signed)
Opened encounter in error  

## 2013-10-05 NOTE — Telephone Encounter (Signed)
Ok to refill? Please advise.  

## 2013-10-16 ENCOUNTER — Ambulatory Visit
Admission: RE | Admit: 2013-10-16 | Discharge: 2013-10-16 | Disposition: A | Discharge status: Home / Self Care | Payer: 59 | Source: Ambulatory Visit | Attending: Internal Medicine | Admitting: Internal Medicine

## 2013-10-16 ENCOUNTER — Encounter: Payer: Self-pay | Admitting: Internal Medicine

## 2013-12-07 ENCOUNTER — Other Ambulatory Visit: Payer: Self-pay | Admitting: Family Medicine

## 2014-02-20 ENCOUNTER — Encounter: Payer: Self-pay | Admitting: Family Medicine

## 2014-08-27 ENCOUNTER — Encounter: Payer: Self-pay | Admitting: Internal Medicine

## 2014-08-28 ENCOUNTER — Encounter: Payer: Self-pay | Admitting: Obstetrics and Gynecology

## 2014-08-28 ENCOUNTER — Ambulatory Visit (INDEPENDENT_AMBULATORY_CARE_PROVIDER_SITE_OTHER): Payer: 59 | Admitting: Obstetrics and Gynecology

## 2014-08-28 VITALS — BP 124/82 | HR 82 | Temp 98.4°F | Ht 65.0 in | Wt 134.3 lb

## 2014-08-28 DIAGNOSIS — M542 Cervicalgia: Secondary | ICD-10-CM | POA: Diagnosis not present

## 2014-08-28 NOTE — Patient Instructions (Signed)
Christy [name] it was great to meet you today!  Here are some of the things we discussed today: -Monitor this area for any changes such as growing or worsening discomfort -I will call and send you the results of your imaging -At this time I would just take OTC tylenol or ibuprofen for pain/discomfort  Please schedule a follow-up as needed.   Thanks for allowing me to be a part of your care! Dr. Gerarda Fraction

## 2014-08-28 NOTE — Addendum Note (Signed)
Addended by: Katheren Shams on: 08/28/2014 08:09 PM   Modules accepted: Orders, Medications

## 2014-08-28 NOTE — Assessment & Plan Note (Signed)
Mass that patient feels under clavicle appears to be just normal anatomy (possible 1st rib); same area palpated on L side but not as prominent. However to due patient with history of thyroid cancer and anxiety about the area will obtain x-ray to help rule-out mass. Patient given reassurance. Advised patient to monitor area for any changes.

## 2014-08-28 NOTE — Progress Notes (Signed)
    Subjective: Chief Complaint  Patient presents with  . Shoulder Pain    knot under collar bone    HPI: Christy [name] is a 44 y.o. presenting to same day clinic today to discuss the following:  #R. Shoulder and clavicle discomfort: -R shoulder pain and muscle ache that started around January. -believes discomfort could be coming from occupation and having to carry large laptop around -severity was 6/10 and she took ibuprofen with some relief -now states that shoulder and muscle pain has resolved since she has stopped carrying such big bags -however even though discomfort in shoulder resolved patient still felt a uncomfortable sensation under her "collar bone" -describes it as "just not feeling right" -patient was rubbing her collar bone were the discomfort was Sunday night as discovered a "hard knot"  -describes a knot above the clavicle that is hard but non-tender -worried about this area due to her history of thyroid cancer and not being able to palpate this same area on Left side  #H/o thyroid cancer:  -patient had thyroidectomy and radioatcive iodine -now on synthroid -diagnosed 12 years ago -she follows with endocrinology   Patient has not had a mammogram in 4 years.  ROS reviewed and were negative unless otherwise noted in HPI.   Objective: BP 124/82 mmHg  Pulse 82  Temp(Src) 98.4 F (36.9 C) (Oral)  Ht 5\' 5"  (1.651 m)  Wt 134 lb 4.8 oz (60.918 kg)  BMI 22.35 kg/m2  LMP 08/28/2014 (Exact Date)  Physical Exam  Constitutional: She is well-developed, well-nourished, and in no distress.  Musculoskeletal: Normal range of motion. She exhibits no tenderness.  Hard mass palpated above the right supraclavicular region(smaller but similar location of a mass on L). Area is non-tender to palpation.   Lymphadenopathy:    She has no cervical adenopathy.    She has no axillary adenopathy.  Skin: Skin is warm and dry. No rash noted. No erythema.    Assessment/Plan: Please see problem based Assessment and Plan   Orders Placed This Encounter  Procedures  . DG Chest 2 View    Standing Status: Future     Number of Occurrences:      Standing Expiration Date: 10/28/2015    Order Specific Question:  Reason for Exam (SYMPTOM  OR DIAGNOSIS REQUIRED)    Answer:  possible subclavicular mass    Order Specific Question:  Is the patient pregnant?    Answer:  No    Order Specific Question:  Preferred imaging location?    Answer:  [facility]    [place], [place] 08/28/2014, 7:54 PM PGY-1, [place]

## 2014-08-29 ENCOUNTER — Ambulatory Visit
Admission: RE | Admit: 2014-08-29 | Discharge: 2014-08-29 | Disposition: A | Discharge status: Home / Self Care | Payer: 59 | Source: Ambulatory Visit | Attending: Family Medicine | Admitting: Family Medicine

## 2014-08-29 DIAGNOSIS — M542 Cervicalgia: Secondary | ICD-10-CM

## 2014-08-30 ENCOUNTER — Encounter: Payer: Self-pay | Admitting: Obstetrics and Gynecology

## 2014-08-30 NOTE — Telephone Encounter (Signed)
Patient requesting xray results done 08/29/14. Will forward to MD covering for PCP for review. Hevin Jeffcoat, CMA.

## 2014-08-31 ENCOUNTER — Other Ambulatory Visit: Payer: Self-pay | Admitting: Family Medicine

## 2014-08-31 DIAGNOSIS — R221 Localized swelling, mass and lump, neck: Secondary | ICD-10-CM

## 2014-08-31 NOTE — Telephone Encounter (Signed)
Called Pt. To discuss CXR results. It appears that they cannot rule out Subclavicular Adenopathy on the CXR. The recommendation is for CT scan to further evaluate the subclavicular soft tissues. I called and discussed this with Christy [name] who acknowledged this result, and she is on board with getting a CT scan with contrast early next week. I will communicate this to Dr. Gerarda Fraction upon her return.   CGM, MD

## 2014-09-04 ENCOUNTER — Ambulatory Visit (HOSPITAL_COMMUNITY): Payer: 59

## 2014-09-05 ENCOUNTER — Encounter: Payer: Self-pay | Admitting: Obstetrics and Gynecology

## 2014-09-05 ENCOUNTER — Ambulatory Visit (HOSPITAL_COMMUNITY)
Admission: RE | Admit: 2014-09-05 | Discharge: 2014-09-05 | Disposition: A | Discharge status: Home / Self Care | Payer: 59 | Source: Ambulatory Visit | Attending: Family Medicine | Admitting: Family Medicine

## 2014-09-05 DIAGNOSIS — R221 Localized swelling, mass and lump, neck: Secondary | ICD-10-CM | POA: Insufficient documentation

## 2014-09-05 DIAGNOSIS — Z85850 Personal history of malignant neoplasm of thyroid: Secondary | ICD-10-CM | POA: Insufficient documentation

## 2014-09-05 DIAGNOSIS — Z9089 Acquired absence of other organs: Secondary | ICD-10-CM | POA: Diagnosis not present

## 2014-09-05 MED ORDER — IOHEXOL 300 MG/ML  SOLN
80.0000 mL | Freq: Once | INTRAMUSCULAR | Status: AC | PRN
  Administered 2014-09-05: 80 mL via INTRAVENOUS

## 2014-09-27 ENCOUNTER — Ambulatory Visit (INDEPENDENT_AMBULATORY_CARE_PROVIDER_SITE_OTHER): Payer: 59 | Admitting: Internal Medicine

## 2014-09-27 ENCOUNTER — Encounter: Payer: Self-pay | Admitting: Internal Medicine

## 2014-09-27 VITALS — BP 114/64 | HR 87 | Temp 98.6°F | Resp 14 | Ht 65.0 in | Wt 135.1 lb

## 2014-09-27 DIAGNOSIS — E032 Hypothyroidism due to medicaments and other exogenous substances: Secondary | ICD-10-CM | POA: Diagnosis not present

## 2014-09-27 DIAGNOSIS — J302 Other seasonal allergic rhinitis: Secondary | ICD-10-CM | POA: Diagnosis not present

## 2014-09-27 MED ORDER — FLUTICASONE PROPIONATE 50 MCG/ACT NA SUSP: 2.0000 | Freq: Every day | NASAL | Status: DC

## 2014-09-27 NOTE — Progress Notes (Signed)
Pre visit review using our clinic review tool, if applicable. No additional management support is needed unless otherwise documented below in the visit note. 

## 2014-09-27 NOTE — Patient Instructions (Signed)
We have sent in the flonase to the [facility].   We will plan to do the next mammogram when you are around 44 and same with labs.   We can see you back in about 1-2 years. If you have problems sooner please feel free to call us.   Health Maintenance Adopting a healthy lifestyle and getting preventive care can go a long way to promote health and wellness. Talk with your health care provider about what schedule of regular examinations is right for you. This is a good chance for you to check in with your provider about disease prevention and staying healthy. In between checkups, there are plenty of things you can do on your own. Experts have done a lot of research about which lifestyle changes and preventive measures are most likely to keep you healthy. Ask your health care provider for more information. WEIGHT AND DIET  Eat a healthy diet  Be sure to include plenty of vegetables, fruits, low-fat dairy products, and lean protein.  Do not eat a lot of foods high in solid fats, added sugars, or salt.  Get regular exercise. This is one of the most important things you can do for your health.  Most adults should exercise for at least 150 minutes each week. The exercise should increase your heart rate and make you sweat (moderate-intensity exercise).  Most adults should also do strengthening exercises at least twice a week. This is in addition to the moderate-intensity exercise.  Maintain a healthy weight  Body mass index (BMI) is a measurement that can be used to identify possible weight problems. It estimates body fat based on height and weight. Your health care provider can help determine your BMI and help you achieve or maintain a healthy weight.  For females 67 years of age and older:   A BMI below 18.5 is considered underweight.  A BMI of 18.5 to 24.9 is normal.  A BMI of 25 to 29.9 is considered overweight.  A BMI of 30 and above is considered obese.  Watch levels of  cholesterol and blood lipids  You should start having your blood tested for lipids and cholesterol at 43 years of age, then have this test every 5 years.  You may need to have your cholesterol levels checked more often if:  Your lipid or cholesterol levels are high.  You are older than 43 years of age.  You are at high risk for heart disease.  CANCER SCREENING   Lung Cancer  Lung cancer screening is recommended for adults 43-68 years old who are at high risk for lung cancer because of a history of smoking.  A yearly low-dose CT scan of the lungs is recommended for people who:  Currently smoke.  Have quit within the past 15 years.  Have at least a 30-pack-year history of smoking. A pack year is smoking an average of one pack of cigarettes a day for 1 year.  Yearly screening should continue until it has been 15 years since you quit.  Yearly screening should stop if you develop a health problem that would prevent you from having lung cancer treatment.  Breast Cancer  Practice breast self-awareness. This means understanding how your breasts normally appear and feel.  It also means doing regular breast self-exams. Let your health care provider know about any changes, no matter how small.  If you are in your 20s or 30s, you should have a clinical breast exam (CBE) by a health care provider  every 1-3 years as part of a regular health exam.  If you are 31 or older, have a CBE every year. Also consider having a breast X-ray (mammogram) every year.  If you have a family history of breast cancer, talk to your health care provider about genetic screening.  If you are at high risk for breast cancer, talk to your health care provider about having an MRI and a mammogram every year.  Breast cancer gene (BRCA) assessment is recommended for women who have family members with BRCA-related cancers. BRCA-related cancers include:  Breast.  Ovarian.  Tubal.  Peritoneal  cancers.  Results of the assessment will determine the need for genetic counseling and BRCA1 and BRCA2 testing. Cervical Cancer Routine pelvic examinations to screen for cervical cancer are no longer recommended for nonpregnant women who are considered low risk for cancer of the pelvic organs (ovaries, uterus, and vagina) and who do not have symptoms. A pelvic examination may be necessary if you have symptoms including those associated with pelvic infections. Ask your health care provider if a screening pelvic exam is right for you.   The Pap test is the screening test for cervical cancer for women who are considered at risk.  If you had a hysterectomy for a problem that was not cancer or a condition that could lead to cancer, then you no longer need Pap tests.  If you are older than 43 years, and you have had normal Pap tests for the past 10 years, you no longer need to have Pap tests.  If you have had past treatment for cervical cancer or a condition that could lead to cancer, you need Pap tests and screening for cancer for at least 20 years after your treatment.  If you no longer get a Pap test, assess your risk factors if they change (such as having a new sexual partner). This can affect whether you should start being screened again.  Some women have medical problems that increase their chance of getting cervical cancer. If this is the case for you, your health care provider may recommend more frequent screening and Pap tests.  The human papillomavirus (HPV) test is another test that may be used for cervical cancer screening. The HPV test looks for the virus that can cause cell changes in the cervix. The cells collected during the Pap test can be tested for HPV.  The HPV test can be used to screen women 43 years of age and older. Getting tested for HPV can extend the interval between normal Pap tests from three to five years.  An HPV test also should be used to screen women of any age who  have unclear Pap test results.  After 43 years of age, women should have HPV testing as often as Pap tests.  Colorectal Cancer  This type of cancer can be detected and often prevented.  Routine colorectal cancer screening usually begins at 43 years of age and continues through 43 years of age.  Your health care provider may recommend screening at an earlier age if you have risk factors for colon cancer.  Your health care provider may also recommend using home test kits to check for hidden blood in the stool.  A small camera at the end of a tube can be used to examine your colon directly (sigmoidoscopy or colonoscopy). This is done to check for the earliest forms of colorectal cancer.  Routine screening usually begins at age 38.  Direct examination of the colon should  be repeated every 5-10 years through 43 years of age. However, you may need to be screened more often if early forms of precancerous polyps or small growths are found. Skin Cancer  Check your skin from head to toe regularly.  Tell your health care provider about any new moles or changes in moles, especially if there is a change in a mole's shape or color.  Also tell your health care provider if you have a mole that is larger than the size of a pencil eraser.  Always use sunscreen. Apply sunscreen liberally and repeatedly throughout the day.  Protect yourself by wearing long sleeves, pants, a wide-brimmed hat, and sunglasses whenever you are outside. HEART DISEASE, DIABETES, AND HIGH BLOOD PRESSURE   Have your blood pressure checked at least every 1-2 years. High blood pressure causes heart disease and increases the risk of stroke.  If you are between 64 years and 47 years old, ask your health care provider if you should take aspirin to prevent strokes.  Have regular diabetes screenings. This involves taking a blood sample to check your fasting blood sugar level.  If you are at a normal weight and have a low risk for  diabetes, have this test once every three years after 43 years of age.  If you are overweight and have a high risk for diabetes, consider being tested at a younger age or more often. PREVENTING INFECTION  Hepatitis B  If you have a higher risk for hepatitis B, you should be screened for this virus. You are considered at high risk for hepatitis B if:  You were born in a country where hepatitis B is common. Ask your health care provider which countries are considered high risk.  Your parents were born in a high-risk country, and you have not been immunized against hepatitis B (hepatitis B vaccine).  You have HIV or AIDS.  You use needles to inject street drugs.  You live with someone who has hepatitis B.  You have had sex with someone who has hepatitis B.  You get hemodialysis treatment.  You take certain medicines for conditions, including cancer, organ transplantation, and autoimmune conditions. Hepatitis C  Blood testing is recommended for:  Everyone born from [birth year] through 1964.  Anyone with known risk factors for hepatitis C. Sexually transmitted infections (STIs)  You should be screened for sexually transmitted infections (STIs) including gonorrhea and chlamydia if:  You are sexually active and are younger than 43 years of age.  You are older than 44 years of age and your health care provider tells you that you are at risk for this type of infection.  Your sexual activity has changed since you were last screened and you are at an increased risk for chlamydia or gonorrhea. Ask your health care provider if you are at risk.  If you do not have HIV, but are at risk, it may be recommended that you take a prescription medicine daily to prevent HIV infection. This is called pre-exposure prophylaxis (PrEP). You are considered at risk if:  You are sexually active and do not regularly use condoms or know the HIV status of your partner(s).  You take drugs by injection.  You are  sexually active with a partner who has HIV. Talk with your health care provider about whether you are at high risk of being infected with HIV. If you choose to begin PrEP, you should first be tested for HIV. You should then be tested every 3 months for as  long as you are taking PrEP.  PREGNANCY   If you are premenopausal and you may become pregnant, ask your health care provider about preconception counseling.  If you may become pregnant, take 400 to 800 micrograms (mcg) of folic acid every day.  If you want to prevent pregnancy, talk to your health care provider about birth control (contraception). OSTEOPOROSIS AND MENOPAUSE   Osteoporosis is a disease in which the bones lose minerals and strength with aging. This can result in serious bone fractures. Your risk for osteoporosis can be identified using a bone density scan.  If you are 69 years of age or older, or if you are at risk for osteoporosis and fractures, ask your health care provider if you should be screened.  Ask your health care provider whether you should take a calcium or vitamin D supplement to lower your risk for osteoporosis.  Menopause may have certain physical symptoms and risks.  Hormone replacement therapy may reduce some of these symptoms and risks. Talk to your health care provider about whether hormone replacement therapy is right for you.  HOME CARE INSTRUCTIONS   Schedule regular health, dental, and eye exams.  Stay current with your immunizations.   Do not use any tobacco products including cigarettes, chewing tobacco, or electronic cigarettes.  If you are pregnant, do not drink alcohol.  If you are breastfeeding, limit how much and how often you drink alcohol.  Limit alcohol intake to no more than 1 drink per day for nonpregnant women. One drink equals 12 ounces of beer, 5 ounces of wine, or 1 ounces of hard liquor.  Do not use street drugs.  Do not share needles.  Ask your health care provider for  help if you need support or information about quitting drugs.  Tell your health care provider if you often feel depressed.  Tell your health care provider if you have ever been abused or do not feel safe at home. Document Released: 11/17/2010 Document Revised: 09/18/2013 Document Reviewed: 04/05/2013 [facility] Patient Information 2015 [place], [place]. This information is not intended to replace advice given to you by your health care provider. Make sure you discuss any questions you have with your health care provider.

## 2014-09-28 ENCOUNTER — Other Ambulatory Visit: Payer: Self-pay | Admitting: Internal Medicine

## 2014-09-28 ENCOUNTER — Encounter: Payer: Self-pay | Admitting: Internal Medicine

## 2014-09-28 NOTE — Assessment & Plan Note (Signed)
Follows with endocrinology and recent imaging for small knot without signs of recurrence.

## 2014-09-28 NOTE — Assessment & Plan Note (Signed)
Rx for flonase. She was continue the claritin over the counter. Talked to her about sinus rinses if that does not work. She is only having mild symptoms now.

## 2014-09-28 NOTE — Progress Notes (Signed)
   Subjective:    Patient ID: Christy [name], female    DOB: Sep 30, 1970, 44 y.o.   MRN: [id number]  HPI The patient is a new 44 YO female who is coming in today for her allergies. She has been taking claritin over the counter and has tried flonase with some more success. She struggles with the pollen and in the summer to fall. Does better in the winter with her allergies. No fevers or chills. No SOB or cough. No sore throat. Mild nasal congestion and no ear pain or fullness.   PMH, [facility], social history reviewed and updated with the patient.   Review of Systems  Constitutional: Negative.   HENT: Positive for congestion. Negative for ear discharge, ear pain, nosebleeds, postnasal drip, sinus pressure, sore throat and trouble swallowing.   Eyes: Negative.   Respiratory: Negative for cough, chest tightness, shortness of breath and wheezing.   Cardiovascular: Negative for chest pain, palpitations and leg swelling.  Gastrointestinal: Negative.   Musculoskeletal: Negative.   Skin: Negative.   Neurological: Negative.   Psychiatric/Behavioral: Negative.       Objective:   Physical Exam  Constitutional: She is oriented to person, place, and time. She appears well-developed and well-nourished.  HENT:  Head: Normocephalic and atraumatic.  Right Ear: External ear normal.  Left Ear: External ear normal.  Nose: Nose normal.  Oropharynx with mild erythema and slight clear drainage.   Eyes: EOM are normal.  Neck: Normal range of motion.  Cardiovascular: Normal rate and regular rhythm.   Pulmonary/Chest: Effort normal and breath sounds normal. No respiratory distress. She has no wheezes. She has no rales.  Abdominal: Soft. She exhibits no distension. There is no tenderness. There is no rebound.  Musculoskeletal: She exhibits no edema.  Neurological: She is alert and oriented to person, place, and time. Coordination normal.  Skin: Skin is warm and dry.  Psychiatric: She has a normal mood and  affect.   Filed Vitals:   09/27/14 1300  BP: 114/64  Pulse: 87  Temp: 98.6 F (37 C)  TempSrc: Oral  Resp: 14  Height: 5\' 5"  (1.651 m)  Weight: 135 lb 1.9 oz (61.29 kg)  SpO2: 96%      Assessment & Plan:

## 2014-10-03 ENCOUNTER — Other Ambulatory Visit: Payer: Self-pay | Admitting: *Deleted

## 2014-10-03 MED ORDER — LEVOTHYROXINE SODIUM 125 MCG PO TABS: 125.0000 ug | ORAL_TABLET | Freq: Every day | ORAL | Status: DC

## 2014-12-12 ENCOUNTER — Ambulatory Visit (INDEPENDENT_AMBULATORY_CARE_PROVIDER_SITE_OTHER): Payer: 59 | Admitting: Internal Medicine

## 2014-12-12 ENCOUNTER — Encounter: Payer: Self-pay | Admitting: Internal Medicine

## 2014-12-12 VITALS — BP 112/64 | HR 82 | Temp 98.4°F | Resp 12 | Wt 136.4 lb

## 2014-12-12 DIAGNOSIS — E032 Hypothyroidism due to medicaments and other exogenous substances: Secondary | ICD-10-CM

## 2014-12-12 DIAGNOSIS — C73 Malignant neoplasm of thyroid gland: Secondary | ICD-10-CM

## 2014-12-12 LAB — TSH: TSH: 1.45 u[IU]/mL (ref 0.35–4.50)

## 2014-12-12 LAB — T4, FREE: FREE T4: 1.2 ng/dL (ref 0.60–1.60)

## 2014-12-12 MED ORDER — LEVOTHYROXINE SODIUM 125 MCG PO TABS: 125.0000 ug | ORAL_TABLET | Freq: Every day | ORAL | Status: DC

## 2014-12-12 NOTE — Progress Notes (Signed)
Patient ID: Christy [name], female   DOB: 1971-02-13, 44 y.o.   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 She was seen by Dr Christy [name] in the past. Last visit with me 1 year and 2 mo ago.  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 Almetta [name] note, stimulated Tg was 2.9 08/10/2008 Thyrogen stimulated WBS negative 10/15/2009 ultrasound neck with 3 abnormal cervical lymph nodes 11/08/2009 biopsy of one of the lymph node that was found to be without fatty hilum: negative for metastases 09/26/2010 Thyrogen stimulated WBS negative except uptake in colon 10/07/2012 Thyrogen stimulated WBS negative ! Patient has a Thyrogen allergy >> needs pretreatment with steroids 10/02/2013: Tg 0.2, ATA <1 10/16/2013: Neck U/S:  Small area of irregular tissue in the left thyroid bed measures roughly 1 cm in length and 0.5 cm in width. This is very ill-defined appearing and may represent scar tissue. Thyroid carcinoma recurrence cannot be completely excluded. Prior whole body nuclear medicine study was negative for any thyroid activity in the neck. No abnormal tissue is identified in the right thyroid bed. No abnormal lymph nodes are identified.  Per Dr. Almetta [name] note, patient had many WBS's and a PET scan in the past as she had positive stimulated thyroglobulin, without an identified Thyroid tissue focus.  I reviewed pt's thyroid tests per records from Dr Christy [name].: 06/28/2013: TSH 1.1   10/07/2012: Tg 2.0, ATA <1 09/16/2012: TSH 0.644, Tg 0.1, ATA <1 2013: Tg <0.5, ATA <20 12/2007: Tg <0.5, TSH 0.1 03/2007: Tg <0.5, Stimulated Tg 2.7 07/14/2006: Tg 1, ATA 3.6  Pt. is on Synthroid (DAW) 125 mcg, taken: - fasting - with water - separated by >45 min from b'fast  - no calcium, iron, PPIs, multivitamins   Pt denies feeling nodules in neck, hoarseness, dysphagia/odynophagia, SOB with lying down.  Pt denies: - cold intolerance - weight gain - fatigue - constipation - dry skin - hair falling - depression/anxiety  ROS: Constitutional: no weight gain/loss, no fatigue, no subjective hyperthermia/hypothermia, occasional poor sleep Eyes: no blurry vision, no xerophthalmia ENT: no sore throat, no nodules palpated in throat, no dysphagia/odynophagia, no hoarseness Cardiovascular: no CP/SOB/palpitations/leg swelling Respiratory: no cough/SOB Gastrointestinal: no N/V/D/C Musculoskeletal: no muscle/joint aches + shoulder pain  Skin: no rashes Neurological: no tremors/numbness/tingling/dizziness  I reviewed pt's medications, allergies, PMH, social hx, family hx, and changes were documented in the history of present illness. Otherwise, unchanged from my initial visit note.  Past Medical History  Diagnosis Date  . Thyroid cancer 2004    s/p thyroidectomy    History   Social History  . Marital Status: Married    Spouse Name: N/A    Number of Children: 2   Occupational History  . Systems analyst - [employer]   Social History Main Topics  . Smoking status: Never Smoker   . Smokeless tobacco: No  . Alcohol Use: Occasional: wine, beer  . Drug Use: No  Exercise: walk/run - daily   Current Outpatient Prescriptions on File Prior to Visit  Medication Sig Dispense Refill  . cetirizine (ZYRTEC) 10 MG  tablet Take 1 tablet (10 mg total) by mouth daily. 30 tablet 11  . fluticasone (FLONASE) 50 MCG/ACT nasal spray Place 2 sprays into both nostrils daily. 16 g 11  .  levothyroxine (SYNTHROID, LEVOTHROID) 125 MCG tablet Take 1 tablet (125 mcg total) by mouth daily. 90 tablet 0  . Melatonin 3 MG TABS Take by mouth.     No current facility-administered medications on file prior to visit.   Allergies  Allergen Reactions  . Neosporin [Neomycin-Bacitracin Zn-Polymyx]   . Thyrogen [Thyrotropin] Other (See Comments)    Thyrogen allergy >> needs pretreatment with steroids   FH: H/o heart ds. in father  PE: BP 112/64 mmHg  Pulse 82  Temp(Src) 98.4 F (36.9 C) (Oral)  Resp 12  Wt 136 lb 6.4 oz (61.871 kg)  SpO2 98% Wt Readings from Last 3 Encounters:  12/12/14 136 lb 6.4 oz (61.871 kg)  09/27/14 135 lb 1.9 oz (61.29 kg)  08/28/14 134 lb 4.8 oz (60.918 kg)   Constitutional: normal weight, in NAD Eyes: PERRLA, EOMI, no exophthalmos ENT: moist mucous membranes, no thyroid nodules palpable, but small 1 cm soft mass palp L lower neck, cervical scar healed, no cervical lymphadenopathy Cardiovascular: RRR, No MRG Respiratory: CTA B Gastrointestinal: abdomen soft, NT, ND, BS+ Musculoskeletal: no deformities, strength intact in all 4 Skin: moist, warm, no rashes Neurological: no tremor with outstretched hands, DTR normal in all 4  ASSESSMENT: 1. Follicular thyroid cancer - Thyroid U/S (10/16/2013): Small area of irregular tissue in the left thyroid bed measures roughly 1 cm in length and 0.5 cm in width. This is very ill-defined appearing and may represent scar tissue. Thyroid carcinoma recurrence cannot be completely excluded. Prior whole body nuclear medicine study was negative for any thyroid activity in the neck. No abnormal tissue is identified in the right thyroid bed. No abnormal lymph nodes are identified.  2. Iatrogenic Hypothyroidism  PLAN:  1. Follicular ThyCa - in remission - last Tg detectable, but very low, at 0.2. She has had levels a little higher in the past, alternating with undetectable levels. I believe this is due the Tg  assay. ATA negative - U/S of neck in 10/16/2013 >> scar tissue vs thyroid remnant in thyroid bed, but this has been present since her Sx per records from Dr Christy [name]. Per review of records from Dr Christy [name], that was the reason for the many WBS's and the PET that she had over the years. Since Tg very low and no abnormal LNs >> we just continued to follow  2. Postsurgical hypothyroidism, on levothyroxine therapy.  - She appears euthyroid. - We discussed about correct intake of levothyroxine, fasting, with water, separated by at least 30 minutes from breakfast, and separated by more than 4 hours from calcium, iron, multivitamins, acid reflux medications (PPIs). - She does not appear to have a goiter, thyroid nodules, or neck compression symptoms - we'll check thyroid tests today: TSH, free T4, and will add a Tg + ATA ([facility]) - target TSH for her is now the normal range - I will see her back in 1 year  Office Visit on 12/12/2014  Component Date Value Ref Range Status  . TSH 12/12/2014 1.45  0.35 - 4.50 uIU/mL Final  . Free T4 12/12/2014 1.20  0.60 - 1.60 ng/dL Final   Excellent results!

## 2014-12-12 NOTE — Patient Instructions (Signed)
Please stop at the lab.  Please return in 1 year.  

## 2014-12-21 ENCOUNTER — Encounter: Payer: Self-pay | Admitting: Internal Medicine

## 2014-12-24 ENCOUNTER — Other Ambulatory Visit: Payer: Self-pay | Admitting: *Deleted

## 2014-12-24 DIAGNOSIS — E032 Hypothyroidism due to medicaments and other exogenous substances: Secondary | ICD-10-CM

## 2014-12-25 ENCOUNTER — Other Ambulatory Visit: Payer: 59

## 2014-12-25 DIAGNOSIS — E032 Hypothyroidism due to medicaments and other exogenous substances: Secondary | ICD-10-CM

## 2014-12-31 LAB — THYROGLOBULIN LEVEL: Thyroglobulin (TG-RIA): <2 ng/mL

## 2014-12-31 LAB — THYROGLOBULIN ANTIBODY: Thyroglobulin Antibody: <1 IU/mL (ref 0.0–0.9)

## 2015-02-22 ENCOUNTER — Ambulatory Visit (INDEPENDENT_AMBULATORY_CARE_PROVIDER_SITE_OTHER): Payer: 59 | Admitting: Internal Medicine

## 2015-02-22 ENCOUNTER — Encounter: Payer: Self-pay | Admitting: Internal Medicine

## 2015-02-22 VITALS — BP 120/76 | HR 82 | Temp 98.5°F | Resp 14 | Ht 65.0 in | Wt 137.1 lb

## 2015-02-22 DIAGNOSIS — S99929A Unspecified injury of unspecified foot, initial encounter: Secondary | ICD-10-CM | POA: Insufficient documentation

## 2015-02-22 DIAGNOSIS — S99921A Unspecified injury of right foot, initial encounter: Secondary | ICD-10-CM

## 2015-02-22 NOTE — Assessment & Plan Note (Signed)
Right great toe without signs of infected or retained splinter. Tetanus up to date. No indication for antibiotics. Advised ibuprofen for 1 week scheduled then prn for pain. Can ice if needed. Return for redness, swelling, discharge.

## 2015-02-22 NOTE — Progress Notes (Signed)
Pre visit review using our clinic review tool, if applicable. No additional management support is needed unless otherwise documented below in the visit note. 

## 2015-02-22 NOTE — Patient Instructions (Signed)
We would like you to take ibuprofen twice a day for the next 1-2 weeks then start backing off to see if you still need it.   You do not need any antibiotics today or anything else .

## 2015-02-22 NOTE — Progress Notes (Signed)
   Subjective:    Patient ID: Christy [name], female    DOB: December 22, 1970, 44 y.o.   MRN: [id number]  HPI The patient is a 44 YO female coming in for right foot injury. She has a splinter in it 1 month ago, was able to remove herself and did not think there was any fragment but it went deep into her great toe. Has done well since that time without redness of the toe or swelling. No discharge. Some pain in the joint intermittent. Started using ibuprofen yesterday which was helped. Able to bend her toe without pain and not hurting everyday. Overall is improving with time. Wanted to make sure there was no infection. Tetanus up to date.   Review of Systems  Constitutional: Negative for fever, activity change, appetite change, fatigue and unexpected weight change.  Respiratory: Negative.   Cardiovascular: Negative.   Musculoskeletal: Positive for arthralgias. Negative for myalgias, back pain, joint swelling and gait problem.  Skin: Negative.   Neurological: Negative.       Objective:   Physical Exam  Constitutional: She is oriented to person, place, and time. She appears well-developed and well-nourished.  HENT:  Head: Normocephalic and atraumatic.  Eyes: EOM are normal.  Neck: Normal range of motion.  Cardiovascular: Normal rate and regular rhythm.   Pulmonary/Chest: Effort normal.  Abdominal: Soft.  Musculoskeletal: She exhibits no tenderness.  Site over the great toe healed, unable to see or feel any splinter fragments in the area where penetration occurred, no tenderness or redness over the joint and freely mobile.   Neurological: She is alert and oriented to person, place, and time.  Skin: Skin is warm and dry. No rash noted.   Filed Vitals:   02/22/15 0950  BP: 120/76  Pulse: 82  Temp: 98.5 F (36.9 C)  TempSrc: Oral  Resp: 14  Height: 5\' 5"  (1.651 m)  Weight: 137 lb 1.9 oz (62.197 kg)  SpO2: 99%      Assessment & Plan:

## 2015-06-06 MED FILL — FLUTICASONE PROP 50 MCG SPR: 50 | 30 days supply | Qty: 16 | Fill #3

## 2015-07-09 MED FILL — FLUTICASONE PROP 50 MCG SPR: 50 | 90 days supply | Qty: 48 | Fill #4

## 2015-07-10 MED FILL — SYNTHROID 125 MCG TABLET: 125 | 90 days supply | Qty: 90 | Fill #2

## 2015-07-25 MED FILL — NATAZIA 28 TABLET: 3/2-2/2-3/1 | 84 days supply | Qty: 84 | Fill #0

## 2015-10-02 ENCOUNTER — Telehealth: Payer: 59 | Admitting: Family

## 2015-10-02 DIAGNOSIS — L089 Local infection of the skin and subcutaneous tissue, unspecified: Secondary | ICD-10-CM | POA: Diagnosis not present

## 2015-10-02 DIAGNOSIS — S0086XA Insect bite (nonvenomous) of other part of head, initial encounter: Secondary | ICD-10-CM | POA: Diagnosis not present

## 2015-10-02 DIAGNOSIS — W57XXXA Bitten or stung by nonvenomous insect and other nonvenomous arthropods, initial encounter: Principal | ICD-10-CM

## 2015-10-02 MED ORDER — DOXYCYCLINE HYCLATE 100 MG PO TABS: 100.0000 mg | ORAL_TABLET | Freq: Two times a day (BID) | ORAL | Status: DC

## 2015-10-02 NOTE — Progress Notes (Signed)
E Visit for Rash  We are sorry that you are not feeling well. Here is how we plan to help!   Based upon what you have shared with me it looks like you may infected bug bite.  Infection of the skin and is treated with an antibiotic. I have prescribed:   Doxycycline 100 mg twice per day for 7 days   HOME CARE:   Take cool showers and avoid direct sunlight.  Apply cool compress or wet dressings.  Take a bath in an oatmeal bath.  Sprinkle content of one Aveeno packet under running faucet with comfortably warm water.  Bathe for 15-20 minutes, 1-2 times daily.  Pat dry with a towel. Do not rub the rash.  Use hydrocortisone cream.  Take an antihistamine like Benadryl for widespread rashes that itch.  The adult dose of Benadryl is 25-50 mg by mouth 4 times daily.  Caution:  This type of medication may cause sleepiness.  Do not drink alcohol, drive, or operate dangerous machinery while taking antihistamines.  Do not take these medications if you have prostate enlargement.  Read package instructions thoroughly on all medications that you take.  GET HELP RIGHT AWAY IF:   Symptoms don't go away after treatment.  Severe itching that persists.  If you rash spreads or swells.  If you rash begins to smell.  If it blisters and opens or develops a yellow-brown crust.  You develop a fever.  You have a sore throat.  You become short of breath.  MAKE SURE YOU:  Understand these instructions. Will watch your condition. Will get help right away if you are not doing well or get worse.  Thank you for choosing an e-visit. Your e-visit answers were reviewed by a board certified advanced clinical practitioner to complete your personal care plan. Depending upon the condition, your plan could have included both over the counter or prescription medications. Please review your pharmacy choice. Be sure that the pharmacy you have chosen is open so that you can pick up your prescription now.  If  there is a problem you may message your provider in [place] to have the prescription routed to another pharmacy. Your safety is important to [place]. If you have drug allergies check your prescription carefully.  For the next 24 hours, you can use MyChart to ask questions about today's visit, request a non-urgent call back, or ask for a work or school excuse from your e-visit provider. You will get an email in the next two days asking about your experience. I hope that your e-visit has been valuable and will speed your recovery.

## 2015-10-03 MED FILL — DOXYCYCLINE HYCLATE 100 MG: 100 | 10 days supply | Qty: 20 | Fill #0

## 2015-10-15 MED FILL — SYNTHROID 125 MCG TABLET: 125 | 90 days supply | Qty: 90 | Fill #3

## 2015-10-15 MED FILL — NATAZIA 28 TABLET: 3/2-2/2-3/1 | 84 days supply | Qty: 84 | Fill #1

## 2015-12-12 ENCOUNTER — Encounter: Payer: Self-pay | Admitting: Internal Medicine

## 2015-12-12 ENCOUNTER — Ambulatory Visit (INDEPENDENT_AMBULATORY_CARE_PROVIDER_SITE_OTHER): Payer: 59 | Admitting: Internal Medicine

## 2015-12-12 VITALS — BP 120/88 | HR 77 | Ht 65.0 in | Wt 142.8 lb

## 2015-12-12 DIAGNOSIS — C73 Malignant neoplasm of thyroid gland: Secondary | ICD-10-CM | POA: Diagnosis not present

## 2015-12-12 DIAGNOSIS — E032 Hypothyroidism due to medicaments and other exogenous substances: Secondary | ICD-10-CM

## 2015-12-12 LAB — TSH: TSH: 1.31 u[IU]/mL (ref 0.35–4.50)

## 2015-12-12 LAB — T4, FREE: FREE T4: 1.3 ng/dL (ref 0.60–1.60)

## 2015-12-12 NOTE — Patient Instructions (Addendum)
Please stop at the lab.  Please continue the current Synthroid dose (125 mcg) daily.  Take the thyroid hormone every day, with water, at least 30 minutes before breakfast, separated by at least 4 hours from: - acid reflux medications - calcium - iron - multivitamins  Please return in 1 year. 

## 2015-12-12 NOTE — Progress Notes (Signed)
Patient ID: Christy [name], female   DOB: 02/09/1971, 45 y.o.   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 She was seen by Dr Chalmers Cater in the past. Last visit with me 1 year ago.  She has some dizziness >>> feels like this is coming from her eyes >> will see her ophthalmologist.  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 Almetta Lovely note, stimulated Tg was 2.9 08/10/2008 Thyrogen stimulated WBS negative 10/15/2009 ultrasound neck with 3 abnormal cervical lymph nodes 11/08/2009 biopsy of one of the lymph node that was found to be without fatty hilum: negative for metastases 09/26/2010 Thyrogen stimulated WBS negative except uptake in colon 10/07/2012 Thyrogen stimulated WBS negative  ! Patient has a Thyrogen allergy >> needs pretreatment with steroids 10/02/2013: Tg 0.2, ATA <1 10/16/2013: Neck U/S:  Small area of irregular tissue in the left thyroid bed measures roughly 1 cm in length and 0.5 cm in width. This is very ill-defined appearing and may represent scar tissue. Thyroid carcinoma recurrence cannot be completely excluded. Prior whole body nuclear medicine study was negative for any thyroid activity in the neck. No abnormal tissue is identified in the right thyroid bed. No abnormal lymph nodes are identified.  Per Dr. Almetta Lovely note, patient had many WBS's and a PET scan in the past as she had positive stimulated thyroglobulin, without an identified Thyroid  tissue focus. 09/05/2014: CT neck and chest: 1. Negative neck CT status post thyroidectomy. No soft tissue mass or lymphadenopathy corresponding to the right supraclavicular palpable area of concern, or elsewhere in the neck. 2. Negative chest CT.  No acute or metastatic process identified. 12/12/2014: TSH 1.45, 12/25/2014: ATA <1, Tg(RIA) <2.0  I reviewed pt's thyroid tests per records from Dr Chalmers Cater.: 07/14/2006: Tg 1, ATA 3.6 03/2007: Tg <0.5, Stimulated Tg 2.7 12/2007: Tg <0.5, TSH 0.1 2013: Tg <0.5, ATA <20 09/16/2012: TSH 0.644, Tg 0.1, ATA <1 10/07/2012: Tg 2.0, ATA <1 06/28/2013: TSH 1.1   Pt. is on Synthroid (DAW) 125 mcg, taken: - fasting - with water - separated by >45 min from b'fast  - no calcium, iron, PPIs, multivitamins   Pt denies feeling nodules in neck, hoarseness, dysphagia/odynophagia, SOB with lying down.  Pt denies: - cold intolerance - weight gain - fatigue - constipation - dry skin - hair falling - depression/anxiety  ROS: Constitutional: no weight gain/loss, no fatigue, no subjective hyperthermia/hypothermia, occasional poor sleep Eyes: no blurry vision, no xerophthalmia ENT: no sore throat, no nodules palpated in throat, no dysphagia/odynophagia, + hoarseness (allergies) Cardiovascular: no CP/SOB/palpitations/leg swelling Respiratory: no cough/SOB Gastrointestinal: no N/V/D/C Musculoskeletal: no muscle/+ joint aches (middle fingers) >> resolved Skin: no rashes Neurological: no tremors/numbness/tingling/dizziness  I reviewed pt's medications, allergies, PMH, social hx, family hx, and changes were documented in the history of present illness. Otherwise, unchanged from my initial visit note.  Past Medical History:  Diagnosis Date  . Thyroid cancer ([place]) 2004   s/p thyroidectomy    History   Social History  . Marital Status: Married    Spouse Name: N/A    Number  of Children: 2   Occupational History  . Systems analyst - [employer]    Social History Main Topics  . Smoking status: Never Smoker   . Smokeless tobacco: No  . Alcohol Use: Occasional: wine, beer  . Drug Use: No  Exercise: walk/run - daily   Current Outpatient Prescriptions on File Prior to Visit  Medication Sig Dispense Refill  . cetirizine (ZYRTEC) 10 MG tablet Take 1 tablet (10 mg total) by mouth daily. 30 tablet 11  . fluticasone (FLONASE) 50 MCG/ACT nasal spray Place 2 sprays into both nostrils daily. 16 g 11  . levothyroxine (SYNTHROID, LEVOTHROID) 125 MCG tablet Take 1 tablet (125 mcg total) by mouth daily. 90 tablet 3  . Melatonin 3 MG TABS Take by mouth.     No current facility-administered medications on file prior to visit.    Allergies  Allergen Reactions  . Neosporin [Neomycin-Bacitracin Zn-Polymyx]   . Thyrogen [Thyrotropin] Other (See Comments)    Thyrogen allergy >> needs pretreatment with steroids   FH: H/o heart ds. in father  PE: BP 120/88   Pulse 77   Ht 5\' 5"  (1.651 m)   Wt 142 lb 12.8 oz (64.8 kg)   SpO2 98%   BMI 23.76 kg/m  Wt Readings from Last 3 Encounters:  12/12/15 142 lb 12.8 oz (64.8 kg)  02/22/15 137 lb 1.9 oz (62.2 kg)  12/12/14 136 lb 6.4 oz (61.9 kg)   Constitutional: normal weight, in NAD Eyes: PERRLA, EOMI, no exophthalmos ENT: moist mucous membranes, no thyroid nodules palpable, cervical scar healed, no cervical lymphadenopathy Cardiovascular: RRR, No MRG Respiratory: CTA B Gastrointestinal: abdomen soft, NT, ND, BS+ Musculoskeletal: no deformities, strength intact in all 4 Skin: moist, warm, no rashes Neurological: no tremor with outstretched hands, DTR normal in all 4  ASSESSMENT: 1. Follicular thyroid cancer - Thyroid U/S (10/16/2013): Small area of irregular tissue in the left thyroid bed measures roughly 1 cm in length and 0.5 cm in width. This is very ill-defined appearing and may represent scar tissue. Thyroid carcinoma recurrence cannot be completely excluded. Prior whole body  nuclear medicine study was negative for any thyroid activity in the neck. No abnormal tissue is identified in the right thyroid bed. No abnormal lymph nodes are identified. - CT neck and chest (09/05/2014):  1. Negative neck CT status post thyroidectomy. No soft tissue mass or lymphadenopathy corresponding to the right supraclavicular palpable area of concern, or elsewhere in the neck. 2. Negative chest CT.  No acute or metastatic process identified.  2. Iatrogenic Hypothyroidism  PLAN:  1. Follicular ThyCa - in remission - last Tg undetectable; this has been detectable before, but very low, alternating with undetectable levels. I believe this is due the Tg assay. ATA negative, but they have been positive in 2008. - U/S of neck in 10/16/2013 >> scar tissue vs thyroid remnant in thyroid bed, but this has been present since her Sx per records from Dr Chalmers Cater. Per review of records from Dr Chalmers Cater, that was the reason for the many WBS's and the PET that she had over the years. She had a CT scan of neck and chest in 2016 >> negative for mets or recurrences.  - will check Tg + ATA today ([facility])  2. Postsurgical hypothyroidism, on levothyroxine therapy.  - She appears euthyroid. - We discussed about correct intake of levothyroxine, fasting, with water, separated by at least 30 minutes from breakfast, and separated by more than 4 hours from calcium, iron, multivitamins,  acid reflux medications (PPIs). She is taking it correctly. - She does not appear to have a goiter, thyroid nodules, or neck compression symptoms - we'll check thyroid tests today: TSH, free T4 - target TSH for her is the normal range, not lower - I will see her back in 1 year  Needs refills for 1 year.  Component     Latest Ref Rng & Units 12/12/2015  TSH     0.35 - 4.50 uIU/mL 1.31  T4,Free(Direct)     0.60 - 1.60 ng/dL 1.30  Thyroglobulin Antibody     0.0 - 0.9 IU/mL <1.0  Thyroglobulin by IMA     1.5 - 38.5 ng/mL 0.2  (L)   Tg again detectable, but very low.  Philemon Kingdom, MD PhD [facility] Endocrinology

## 2015-12-13 LAB — TGAB+THYROGLOBULIN IMA OR RIA: Thyroglobulin Antibody: <1 IU/mL (ref 0.0–0.9)

## 2015-12-13 LAB — THYROGLOBULIN BY IMA: Thyroglobulin by IMA: 0.2 ng/mL — ABNORMAL LOW (ref 1.5–38.5)

## 2015-12-13 MED ORDER — LEVOTHYROXINE SODIUM 125 MCG PO TABS: 125.0000 ug | ORAL_TABLET | Freq: Every day | ORAL | 3 refills | Status: DC

## 2016-01-14 ENCOUNTER — Other Ambulatory Visit: Payer: Self-pay | Admitting: Internal Medicine

## 2016-01-14 DIAGNOSIS — E032 Hypothyroidism due to medicaments and other exogenous substances: Secondary | ICD-10-CM

## 2016-01-14 MED FILL — NATAZIA 28 TABLET: 3/2-2/2-3/1 | 84 days supply | Qty: 84 | Fill #2

## 2016-01-15 MED FILL — SYNTHROID 125 MCG TABLET: 125 | 90 days supply | Qty: 90 | Fill #0

## 2016-02-21 DIAGNOSIS — H5203 Hypermetropia, bilateral: Secondary | ICD-10-CM | POA: Diagnosis not present

## 2016-02-24 ENCOUNTER — Telehealth: Payer: 59 | Admitting: Family

## 2016-02-24 DIAGNOSIS — J301 Allergic rhinitis due to pollen: Secondary | ICD-10-CM

## 2016-02-24 MED ORDER — FLUTICASONE PROPIONATE 50 MCG/ACT NA SUSP: 2.0000 | Freq: Every day | NASAL | 11 refills | Status: DC

## 2016-02-24 MED FILL — FLUTICASONE PROP 50 MCG SPR: 50 | 30 days supply | Qty: 16 | Fill #0

## 2016-02-24 NOTE — Progress Notes (Signed)
E visit for Allergic Rhinitis We are sorry that you are not feeling well.  Her is how we plan to help!  Based on what you have shared with me it looks like you have Allergic Rhinitis.  Rhinitis is when a reaction occurs that causes nasal congestion, runny nose, sneezing, and itching.  Most types of rhinitis are caused by an inflammation and are associated with symptoms in the eyes ears or throat. There are several types of rhinitis.  The most common are acute rhinitis, which is usually caused by a viral illness, allergic or seasonal rhinitis, and nonallergic or year-round rhinitis.  Nasal allergies occur certain times of the year.  Allergic rhinitis is caused when allergens in the air trigger the release of histamine in the body.  Histamine causes itching, swelling, and fluid to build up in the fragile linings of the nasal passages, sinuses and eyelids.  An itchy nose and clear discharge are common.  I recommend the following over the counter treatments: Continue your daily Claritin  I also would recommend a nasal spray: Flonase 2 sprays into each nostril once daily   HOME CARE:   You can use an over-the-counter saline nasal spray as needed  Avoid areas where there is heavy dust, mites, or molds  Stay indoors on windy days during the pollen season  Keep windows closed in home, at least in bedroom; use air conditioner.  Use high-efficiency house air filter  Keep windows closed in car, turn AC on re-circulate  Avoid playing out with dog during pollen season  GET HELP RIGHT AWAY IF:   If your symptoms do not improve within 10 days  You become short of breath  You develop yellow or green discharge from your nose for over 3 days  You have coughing fits  MAKE SURE YOU:   Understand these instructions  Will watch your condition  Will get help right away if you are not doing well or get worse  Thank you for choosing an e-visit. Your e-visit answers were reviewed by a board  certified advanced clinical practitioner to complete your personal care plan. Depending upon the condition, your plan could have included both over the counter or prescription medications. Please review your pharmacy choice. Be sure that the pharmacy you have chosen is open so that you can pick up your prescription now.  If there is a problem you may message your provider in [place] to have the prescription routed to another pharmacy. Your safety is important to [place]. If you have drug allergies check your prescription carefully.  For the next 24 hours, you can use MyChart to ask questions about today's visit, request a non-urgent call back, or ask for a work or school excuse from your e-visit provider. You will get an email in the next two days asking about your experience. I hope that your e-visit has been valuable and will speed your recovery.

## 2016-03-10 ENCOUNTER — Other Ambulatory Visit (HOSPITAL_COMMUNITY)
Admission: RE | Admit: 2016-03-10 | Discharge: 2016-03-10 | Disposition: A | Discharge status: Home / Self Care | Payer: 59 | Source: Ambulatory Visit | Attending: Nurse Practitioner | Admitting: Nurse Practitioner

## 2016-03-10 ENCOUNTER — Other Ambulatory Visit: Payer: Self-pay | Admitting: Nurse Practitioner

## 2016-03-10 DIAGNOSIS — Z1151 Encounter for screening for human papillomavirus (HPV): Secondary | ICD-10-CM | POA: Insufficient documentation

## 2016-03-10 DIAGNOSIS — Z01419 Encounter for gynecological examination (general) (routine) without abnormal findings: Secondary | ICD-10-CM | POA: Insufficient documentation

## 2016-03-10 DIAGNOSIS — Z309 Encounter for contraceptive management, unspecified: Secondary | ICD-10-CM | POA: Diagnosis not present

## 2016-03-10 DIAGNOSIS — Z1329 Encounter for screening for other suspected endocrine disorder: Secondary | ICD-10-CM | POA: Diagnosis not present

## 2016-03-11 LAB — CYTOLOGY - PAP
DIAGNOSIS: NEGATIVE
HPV: NOT DETECTED

## 2016-04-03 MED FILL — FLUTICASONE PROP 50 MCG SPR: 50 | 90 days supply | Qty: 48 | Fill #1

## 2016-04-03 MED FILL — NATAZIA 28 TABLET: 3/2-2/2-3/1 | 84 days supply | Qty: 84 | Fill #0

## 2016-04-07 MED FILL — SYNTHROID 125 MCG TABLET: 125 | 90 days supply | Qty: 90 | Fill #1

## 2016-07-01 MED FILL — FLUTICASONE PROP 50 MCG SPR: 50 | 90 days supply | Qty: 48 | Fill #2

## 2016-07-01 MED FILL — SYNTHROID 125 MCG TABLET: 125 | 90 days supply | Qty: 90 | Fill #2

## 2016-07-01 MED FILL — NATAZIA 28 TABLET: 3/2-2/2-3/1 | 84 days supply | Qty: 84 | Fill #1

## 2016-09-23 MED FILL — FLUTICASONE PROP 50 MCG SPR: 50 | 90 days supply | Qty: 48 | Fill #3

## 2016-09-23 MED FILL — NATAZIA 28 TABLET: 3/2-2/2-3/1 | 84 days supply | Qty: 84 | Fill #2

## 2016-09-23 MED FILL — SYNTHROID 125 MCG TABLET: 125 | 90 days supply | Qty: 90 | Fill #3

## 2016-12-11 ENCOUNTER — Ambulatory Visit (INDEPENDENT_AMBULATORY_CARE_PROVIDER_SITE_OTHER): Payer: 59 | Admitting: Internal Medicine

## 2016-12-11 VITALS — BP 120/80 | HR 83 | Wt 138.0 lb

## 2016-12-11 DIAGNOSIS — C73 Malignant neoplasm of thyroid gland: Secondary | ICD-10-CM

## 2016-12-11 DIAGNOSIS — E032 Hypothyroidism due to medicaments and other exogenous substances: Secondary | ICD-10-CM | POA: Diagnosis not present

## 2016-12-11 LAB — T4, FREE: FREE T4: 1.42 ng/dL (ref 0.60–1.60)

## 2016-12-11 LAB — TSH: TSH: 0.22 u[IU]/mL — AB (ref 0.35–4.50)

## 2016-12-11 NOTE — Progress Notes (Signed)
Patient ID: Christy [name], female   DOB: February 05, 1971, 46 y.o.   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 She was seen by Dr Christy [name] in the past. Last visit with me one year ago.  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 Almetta [name] note, stimulated Tg was 2.9 08/10/2008 Thyrogen stimulated WBS negative 10/15/2009 ultrasound neck with 3 abnormal cervical lymph nodes 11/08/2009 biopsy of one of the lymph node that was found to be without fatty hilum: negative for metastases 09/26/2010 Thyrogen stimulated WBS negative except uptake in colon 10/07/2012 Thyrogen stimulated WBS negative  ! Patient has a Thyrogen allergy >> needs pretreatment with steroids 10/02/2013: Tg 0.2, ATA <1 10/16/2013: Neck U/S:  Small area of irregular tissue in the left thyroid bed measures roughly 1 cm in length and 0.5 cm in width. This is very ill-defined appearing and may represent scar tissue. Thyroid carcinoma recurrence cannot be completely excluded. Prior whole body nuclear medicine study was negative for any thyroid activity in the neck. No abnormal tissue is identified in the right thyroid bed. No abnormal lymph nodes are identified.  Per Dr. Almetta [name] note, patient had many WBS's and a PET scan in the past as she had positive stimulated thyroglobulin, without an identified Thyroid tissue focus. 09/05/2014: CT neck and chest: 1. Negative neck CT status post  thyroidectomy. No soft tissue mass or lymphadenopathy corresponding to the right supraclavicular palpable area of concern, or elsewhere in the neck. 2. Negative chest CT.  No acute or metastatic process identified. 12/12/2014: TSH 1.45, 12/25/2014: ATA <1, Tg(RIA) <2.0  I reviewed pt's thyroid tests per records from Dr Christy [name].: 07/14/2006: Tg 1, ATA 3.6 03/2007: Tg <0.5, Stimulated Tg 2.7 12/2007: Tg <0.5, TSH 0.1 2013: Tg <0.5, ATA <20 09/16/2012: TSH 0.644, Tg 0.1, ATA <1 10/07/2012: Tg 2.0, ATA <1 06/28/2013: TSH 1.1   More recent labs: Lab Results  Component Value Date   TSH 1.31 12/12/2015   TSH 1.45 12/12/2014   TSH 0.96 10/02/2013   FREET4 1.30 12/12/2015   FREET4 1.20 12/12/2014   FREET4 1.52 10/02/2013   Component     Latest Ref Rng & Units 10/02/2013 12/25/2014 12/12/2015  Antithyroglobulin Ab     0.0 - 0.9 IU/mL <1.0    THYROGLOBULIN, SERUM     1.5 - 38.5 ng/mL 0.2 (L)    THYROGLOBULIN (TG-RIA)     ng/mL  <2.0   Thyroglobulin Antibody     0.0 - 0.9 IU/mL  <1.0 <1.0  Thyroglobulin by IMA     1.5 - 38.5 ng/mL   0.2 (L)   Pt. is on Synthroid (DAW) 125 mcg, taken: - in am - fasting - at least 30 min from b'fast - no Ca, Fe, MVI, PPIs - not on Biotin Added Fish Oil 2 weeks ago.  Pt denies: - feeling nodules in neck - hoarseness - dysphagia - choking - SOB with lying down  ROS: Constitutional: no weight gain/no weight loss, no fatigue, no subjective hyperthermia, no subjective hypothermia Eyes: no blurry vision, no xerophthalmia ENT: no  sore throat, no nodules palpated in throat, no dysphagia, no odynophagia, no hoarseness Cardiovascular: no CP/no SOB/no palpitations/no leg swelling Respiratory: no cough/no SOB/no wheezing Gastrointestinal: no N/no V/no D/no C/no acid reflux Musculoskeletal: no muscle aches/+ joint aches Skin: no rashes, no hair loss Neurological: no tremors/no numbness/no tingling/no dizziness  I reviewed pt's medications, allergies,  PMH, social hx, family hx, and changes were documented in the history of present illness. Otherwise, unchanged from my initial visit note.  Past Medical History:  Diagnosis Date  . Thyroid cancer ([place]) 2004   s/p thyroidectomy    History   Social History  . Marital Status: Married    Spouse Name: N/A    Number of Children: 2   Occupational History  . Systems analyst - [employer]   Social History Main Topics  . Smoking status: Never Smoker   . Smokeless tobacco: No  . Alcohol Use: Occasional: wine, beer  . Drug Use: No  Exercise: walk/run - daily   Current Outpatient Prescriptions on File Prior to Visit  Medication Sig Dispense Refill  . cetirizine (ZYRTEC) 10 MG tablet Take 1 tablet (10 mg total) by mouth daily. 30 tablet 11  . fluticasone (FLONASE) 50 MCG/ACT nasal spray Place 2 sprays into both nostrils daily. 16 g 11  . Melatonin 3 MG TABS Take by mouth.    Marland Kitchen NATAZIA 3/2-2/2-3/1 MG tablet   2  . SYNTHROID 125 MCG tablet TAKE 1 TABLET BY MOUTH ONCE DAILY 90 tablet 3   No current facility-administered medications on file prior to visit.    Allergies  Allergen Reactions  . Neosporin [Neomycin-Bacitracin Zn-Polymyx]   . Thyrogen [Thyrotropin] Other (See Comments)    Thyrogen allergy >> needs pretreatment with steroids   FH: H/o heart ds. in father  PE: BP 120/80 (BP Location: Left Arm, Patient Position: Sitting)   Pulse 83   Wt 138 lb (62.6 kg)   LMP 11/23/2016   SpO2 98%   BMI 22.96 kg/m  Wt Readings from Last 3 Encounters:  12/11/16 138 lb (62.6 kg)  12/12/15 142 lb 12.8 oz (64.8 kg)  02/22/15 137 lb 1.9 oz (62.2 kg)   Constitutional: overweight, in NAD Eyes: PERRLA, EOMI, no exophthalmos ENT: moist mucous membranes, thyroidectomy scar healed, no cervical lymphadenopathy; + small mm knot palp. In supraclav. area Cardiovascular: RRR, No MRG Respiratory: CTA B Gastrointestinal: abdomen soft, NT, ND, BS+ Musculoskeletal: no deformities, strength intact in  all 4 Skin: moist, warm, no rashes Neurological: no tremor with outstretched hands, DTR normal in all 4  ASSESSMENT: 1. Follicular thyroid cancer - Thyroid U/S (10/16/2013): Small area of irregular tissue in the left thyroid bed measures roughly 1 cm in length and 0.5 cm in width. This is very ill-defined appearing and may represent scar tissue. Thyroid carcinoma recurrence cannot be completely excluded. Prior whole body nuclear medicine study was negative for any thyroid activity in the neck. No abnormal tissue is identified in the right thyroid bed. No abnormal lymph nodes are identified. - CT neck and chest (09/05/2014):  1. Negative neck CT status post thyroidectomy. No soft tissue mass or lymphadenopathy corresponding to the right supraclavicular palpable area of concern, or elsewhere in the neck. 2. Negative chest CT.  No acute or metastatic process identified.  2. Postsurgical Hypothyroidism  PLAN:  1. Follicular ThyCa - in remission - she has had thyroglobulin levels that were detectable before, but very low, alternating with undetectable levels. I believe that this is due toThyroglobulin assay. Her  antithyroglobulin antibodies are negative, but they have been positive in 2008. We reviewed together her latest set of labs, and thyroglobulin was 0.2 with undetectable antibodies. Will recheck these today ([facility]) - I also reviewed her latest U/S of neck (10/16/2013) >> scar tissue vs thyroid remnant in thyroid bed, but this has been present since her Sx per records from Dr Christy [name]. Per review of records from Dr Christy [name], that was the reason for the many WBS's and the PET that she had over the years. She had a CT scan of the neck and chest in 2016 (with contrast) which was negative for metastases or recurrences. - no neck compression symptoms today and no neck masses palpated. She feels a mass in R supraclavicular area >> on palpation it appears as a mm knot  2. Postsurgical  hypothyroidism, on levothyroxine therapy.  - latest thyroid labs reviewed with pt >> normal  - she continues on Synthroid DAW 125 mcg daily - pt feels good on this dose. - we discussed about taking the thyroid hormone every day, with water, >30 minutes before breakfast, separated by >4 hours from acid reflux medications, calcium, iron, multivitamins. Pt. is taking it correctly - will check thyroid tests today: TSH and fT4 - If labs are abnormal, she will need to return for repeat TFTs in 1.5 months - OTW, RTC in 1 year  Needs refills.  Component     Latest Ref Rng & Units 12/11/2016  TSH     0.35 - 4.50 uIU/mL 0.22 (L)  T4,Free(Direct)     0.60 - 1.60 ng/dL 1.42  Thyroglobulin Antibody     0.0 - 0.9 IU/mL <1.0  Thyroglobulin by IMA     1.5 - 38.5 ng/mL 0.1 (L)   Msg sent: Dear Christy [name], Your thyroglobulin is lower than before, which is great. The TSH is also low, however, but I am a little surprised, since she had normal thyroid tests before. Why don't we stay on the same dose for now but recheck your tests in 2 months? Please call our main office number [phone]) to schedule a lab appointment.  Sincerely, Philemon Kingdom MD   Philemon Kingdom, MD PhD [facility] Endocrinology

## 2016-12-11 NOTE — Patient Instructions (Signed)
Please stop at the lab.  Please continue the current Synthroid dose (125 mcg) daily.  Take the thyroid hormone every day, with water, at least 30 minutes before breakfast, separated by at least 4 hours from: - acid reflux medications - calcium - iron - multivitamins  Please return in 1 year. 

## 2016-12-14 ENCOUNTER — Encounter: Payer: Self-pay | Admitting: Internal Medicine

## 2016-12-14 MED ORDER — SYNTHROID 125 MCG PO TABS: 125.0000 ug | ORAL_TABLET | Freq: Every day | ORAL | 3 refills | Status: DC

## 2016-12-14 MED FILL — SYNTHROID 125 MCG TABLET: 125 | 60 days supply | Qty: 60 | Fill #0

## 2016-12-17 MED FILL — FLUTICASONE PROP 50 MCG SPR: 50 | 60 days supply | Qty: 32 | Fill #4

## 2016-12-17 MED FILL — NATAZIA 28 TABLET: 3/2-2/2-3/1 | 84 days supply | Qty: 84 | Fill #3

## 2017-01-22 LAB — THYROGLOBULIN BY IMA: Thyroglobulin by IMA: <0.1 ng/mL — ABNORMAL LOW (ref 1.5–38.5)

## 2017-01-22 LAB — TGAB+THYROGLOBULIN IMA OR LCMS

## 2017-02-15 ENCOUNTER — Telehealth: Payer: 59 | Admitting: Family

## 2017-02-15 DIAGNOSIS — J301 Allergic rhinitis due to pollen: Secondary | ICD-10-CM

## 2017-02-15 MED ORDER — FLUTICASONE PROPIONATE 50 MCG/ACT NA SUSP: 2.0000 | Freq: Every day | NASAL | 1 refills | Status: DC

## 2017-02-15 MED FILL — FLUTICASONE PROP 50 MCG SPR: 50 | 30 days supply | Qty: 16 | Fill #0

## 2017-02-15 NOTE — Progress Notes (Signed)
Thank you for the details you put in the comment boxes. Those details really help [place] take better care of you.  I am happy to call in your prescription for you now.  E visit for Allergic Rhinitis We are sorry that you are not feeling well.  Her is how we plan to help!  Based on what you have shared with me it looks like you have Allergic Rhinitis.  Rhinitis is when a reaction occurs that causes nasal congestion, runny nose, sneezing, and itching.  Most types of rhinitis are caused by an inflammation and are associated with symptoms in the eyes ears or throat. There are several types of rhinitis.  The most common are acute rhinitis, which is usually caused by a viral illness, allergic or seasonal rhinitis, and nonallergic or year-round rhinitis.  Nasal allergies occur certain times of the year.  Allergic rhinitis is caused when allergens in the air trigger the release of histamine in the body.  Histamine causes itching, swelling, and fluid to build up in the fragile linings of the nasal passages, sinuses and eyelids.  An itchy nose and clear discharge are common.  I recommend the following over the counter treatments: Xyzal 5 mg take 1 tablet daily  I also would recommend a nasal spray: Flonase 2 sprays into each nostril once daily  You may also benefit from eye drops such as: Visine 1-2 drops each eye twice daily as needed  HOME CARE:   You can use an over-the-counter saline nasal spray as needed  Avoid areas where there is heavy dust, mites, or molds  Stay indoors on windy days during the pollen season  Keep windows closed in home, at least in bedroom; use air conditioner.  Use high-efficiency house air filter  Keep windows closed in car, turn AC on re-circulate  Avoid playing out with dog during pollen season  GET HELP RIGHT AWAY IF:   If your symptoms do not improve within 10 days  You become short of breath  You develop yellow or green discharge from your nose for over 3  days  You have coughing fits  MAKE SURE YOU:   Understand these instructions  Will watch your condition  Will get help right away if you are not doing well or get worse  Thank you for choosing an e-visit. Your e-visit answers were reviewed by a board certified advanced clinical practitioner to complete your personal care plan. Depending upon the condition, your plan could have included both over the counter or prescription medications. Please review your pharmacy choice. Be sure that the pharmacy you have chosen is open so that you can pick up your prescription now.  If there is a problem you may message your provider in [place] to have the prescription routed to another pharmacy. Your safety is important to [place]. If you have drug allergies check your prescription carefully.  For the next 24 hours, you can use MyChart to ask questions about today's visit, request a non-urgent call back, or ask for a work or school excuse from your e-visit provider. You will get an email in the next two days asking about your experience. I hope that your e-visit has been valuable and will speed your recovery.

## 2017-02-19 ENCOUNTER — Other Ambulatory Visit (INDEPENDENT_AMBULATORY_CARE_PROVIDER_SITE_OTHER): Payer: 59

## 2017-02-19 DIAGNOSIS — E032 Hypothyroidism due to medicaments and other exogenous substances: Secondary | ICD-10-CM | POA: Diagnosis not present

## 2017-02-19 LAB — T4, FREE: FREE T4: 1.53 ng/dL (ref 0.60–1.60)

## 2017-02-19 LAB — TSH: TSH: 0.21 u[IU]/mL — ABNORMAL LOW (ref 0.35–4.50)

## 2017-02-19 MED ORDER — SYNTHROID 112 MCG PO TABS: 112.0000 ug | ORAL_TABLET | Freq: Every day | ORAL | 5 refills | Status: DC

## 2017-02-22 MED FILL — SYNTHROID 112 MCG TABLET: 112 | 60 days supply | Qty: 60 | Fill #0

## 2017-03-11 DIAGNOSIS — Z309 Encounter for contraceptive management, unspecified: Secondary | ICD-10-CM | POA: Diagnosis not present

## 2017-03-11 DIAGNOSIS — Z01419 Encounter for gynecological examination (general) (routine) without abnormal findings: Secondary | ICD-10-CM | POA: Diagnosis not present

## 2017-03-11 DIAGNOSIS — K130 Diseases of lips: Secondary | ICD-10-CM | POA: Diagnosis not present

## 2017-03-11 MED FILL — FLUTICASONE PROP 50 MCG SPR: 50 | 30 days supply | Qty: 16 | Fill #1

## 2017-03-11 MED FILL — NATAZIA 28 TABLET: 3/2-2/2-3/1 | 84 days supply | Qty: 84 | Fill #0

## 2017-04-23 ENCOUNTER — Other Ambulatory Visit (INDEPENDENT_AMBULATORY_CARE_PROVIDER_SITE_OTHER): Payer: 59

## 2017-04-23 DIAGNOSIS — E032 Hypothyroidism due to medicaments and other exogenous substances: Secondary | ICD-10-CM

## 2017-04-23 LAB — T4, FREE: Free T4: 1.28 ng/dL (ref 0.60–1.60)

## 2017-04-23 LAB — TSH: TSH: 1.43 u[IU]/mL (ref 0.35–4.50)

## 2017-04-30 MED FILL — SYNTHROID 112 MCG TABLET: 112 | 90 days supply | Qty: 90 | Fill #1

## 2017-05-31 MED FILL — NATAZIA 28 TABLET: 3/2-2/2-3/1 | 84 days supply | Qty: 84 | Fill #1

## 2017-07-26 MED FILL — SYNTHROID 112 MCG TABLET: 112 | 90 days supply | Qty: 90 | Fill #2

## 2017-08-18 MED FILL — NATAZIA 28 TABLET: 3/2-2/2-3/1 | 84 days supply | Qty: 84 | Fill #2

## 2017-08-27 ENCOUNTER — Encounter: Payer: Self-pay | Admitting: Nurse Practitioner

## 2017-08-27 ENCOUNTER — Ambulatory Visit (INDEPENDENT_AMBULATORY_CARE_PROVIDER_SITE_OTHER): Payer: No Typology Code available for payment source | Admitting: Nurse Practitioner

## 2017-08-27 VITALS — BP 114/78 | HR 78 | Temp 98.3°F | Ht 65.0 in | Wt 136.0 lb

## 2017-08-27 DIAGNOSIS — J301 Allergic rhinitis due to pollen: Secondary | ICD-10-CM | POA: Diagnosis not present

## 2017-08-27 DIAGNOSIS — G8929 Other chronic pain: Secondary | ICD-10-CM | POA: Diagnosis not present

## 2017-08-27 DIAGNOSIS — Z136 Encounter for screening for cardiovascular disorders: Secondary | ICD-10-CM

## 2017-08-27 DIAGNOSIS — M25551 Pain in right hip: Secondary | ICD-10-CM | POA: Insufficient documentation

## 2017-08-27 DIAGNOSIS — D229 Melanocytic nevi, unspecified: Secondary | ICD-10-CM | POA: Diagnosis not present

## 2017-08-27 DIAGNOSIS — Z13220 Encounter for screening for lipoid disorders: Secondary | ICD-10-CM

## 2017-08-27 DIAGNOSIS — L812 Freckles: Secondary | ICD-10-CM | POA: Diagnosis not present

## 2017-08-27 DIAGNOSIS — Z0000 Encounter for general adult medical examination without abnormal findings: Secondary | ICD-10-CM

## 2017-08-27 DIAGNOSIS — E032 Hypothyroidism due to medicaments and other exogenous substances: Secondary | ICD-10-CM

## 2017-08-27 DIAGNOSIS — Z0001 Encounter for general adult medical examination with abnormal findings: Secondary | ICD-10-CM | POA: Diagnosis not present

## 2017-08-27 DIAGNOSIS — M25552 Pain in left hip: Secondary | ICD-10-CM | POA: Diagnosis not present

## 2017-08-27 MED ORDER — FLUTICASONE PROPIONATE 50 MCG/ACT NA SUSP: 2.0000 | Freq: Every day | NASAL | 11 refills | Status: DC

## 2017-08-27 NOTE — Assessment & Plan Note (Addendum)
onset 87yrs ago, bilateral, waxing and waning, worse with prolong sitting and walking on an incline. Has morning stiffness that can last 1hr. Some improvement with tylenol and stretching. Denies any injury or leg weakness or joint swelling or paresthesia. Fhx of OA (mother with hip and knee replacement), no Hx of RA. Denies any childbirth. She wants to wait on any lab orders and/or referral to sports medicine at this time.

## 2017-08-27 NOTE — Assessment & Plan Note (Addendum)
Re eval on left flank region. Approx. 37mm

## 2017-08-27 NOTE — Patient Instructions (Addendum)
Please return to office fasting (6-8hrs) for blood draw.  Sign medical release for up dated PAP report.  Schedule repeat mammogram and has report faxed to [place].  Return to office in 3-79month for re eval of moles on your back.  Let me know if you change your mind about referral to Sports Medicine [facility] Stretch before and after exercise to minimize hip pain.  Let me know when you need referral to and endocrinolgy entered.  Health Maintenance, Female Adopting a healthy lifestyle and getting preventive care can go a long way to promote health and wellness. Talk with your health care provider about what schedule of regular examinations is right for you. This is a good chance for you to check in with your provider about disease prevention and staying healthy. In between checkups, there are plenty of things you can do on your own. Experts have done a lot of research about which lifestyle changes and preventive measures are most likely to keep you healthy. Ask your health care provider for more information. Weight and diet Eat a healthy diet  Be sure to include plenty of vegetables, fruits, low-fat dairy products, and lean protein.  Do not eat a lot of foods high in solid fats, added sugars, or salt.  Get regular exercise. This is one of the most important things you can do for your health. ? Most adults should exercise for at least 150 minutes each week. The exercise should increase your heart rate and make you sweat (moderate-intensity exercise). ? Most adults should also do strengthening exercises at least twice a week. This is in addition to the moderate-intensity exercise.  Maintain a healthy weight  Body mass index (BMI) is a measurement that can be used to identify possible weight problems. It estimates body fat based on height and weight. Your health care provider can help determine your BMI and help you achieve or maintain a healthy weight.  For females 243years of age  and older: ? A BMI below 18.5 is considered underweight. ? A BMI of 18.5 to 24.9 is normal. ? A BMI of 25 to 29.9 is considered overweight. ? A BMI of 30 and above is considered obese.  Watch levels of cholesterol and blood lipids  You should start having your blood tested for lipids and cholesterol at 46years of age, then have this test every 5 years.  You may need to have your cholesterol levels checked more often if: ? Your lipid or cholesterol levels are high. ? You are older than 46years of age. ? You are at high risk for heart disease.  Cancer screening Lung Cancer  Lung cancer screening is recommended for adults 554813years old who are at high risk for lung cancer because of a history of smoking.  A yearly low-dose CT scan of the lungs is recommended for people who: ? Currently smoke. ? Have quit within the past 15 years. ? Have at least a 30-pack-year history of smoking. A pack year is smoking an average of one pack of cigarettes a day for 1 year.  Yearly screening should continue until it has been 15 years since you quit.  Yearly screening should stop if you develop a health problem that would prevent you from having lung cancer treatment.  Breast Cancer  Practice breast self-awareness. This means understanding how your breasts normally appear and feel.  It also means doing regular breast self-exams. Let your health care provider know about any changes, no  matter how small.  If you are in your 20s or 30s, you should have a clinical breast exam (CBE) by a health care provider every 1-3 years as part of a regular health exam.  If you are 56 or older, have a CBE every year. Also consider having a breast X-ray (mammogram) every year.  If you have a family history of breast cancer, talk to your health care provider about genetic screening.  If you are at high risk for breast cancer, talk to your health care provider about having an MRI and a mammogram every  year.  Breast cancer gene (BRCA) assessment is recommended for women who have family members with BRCA-related cancers. BRCA-related cancers include: ? Breast. ? Ovarian. ? Tubal. ? Peritoneal cancers.  Results of the assessment will determine the need for genetic counseling and BRCA1 and BRCA2 testing.  Cervical Cancer Your health care provider may recommend that you be screened regularly for cancer of the pelvic organs (ovaries, uterus, and vagina). This screening involves a pelvic examination, including checking for microscopic changes to the surface of your cervix (Pap test). You may be encouraged to have this screening done every 3 years, beginning at age 46.  For women ages 19-65, health care providers may recommend pelvic exams and Pap testing every 3 years, or they may recommend the Pap and pelvic exam, combined with testing for human papilloma virus (HPV), every 5 years. Some types of HPV increase your risk of cervical cancer. Testing for HPV may also be done on women of any age with unclear Pap test results.  Other health care providers may not recommend any screening for nonpregnant women who are considered low risk for pelvic cancer and who do not have symptoms. Ask your health care provider if a screening pelvic exam is right for you.  If you have had past treatment for cervical cancer or a condition that could lead to cancer, you need Pap tests and screening for cancer for at least 20 years after your treatment. If Pap tests have been discontinued, your risk factors (such as having a new sexual partner) need to be reassessed to determine if screening should resume. Some women have medical problems that increase the chance of getting cervical cancer. In these cases, your health care provider may recommend more frequent screening and Pap tests.  Colorectal Cancer  This type of cancer can be detected and often prevented.  Routine colorectal cancer screening usually begins at 46  years of age and continues through 46 years of age.  Your health care provider may recommend screening at an earlier age if you have risk factors for colon cancer.  Your health care provider may also recommend using home test kits to check for hidden blood in the stool.  A small camera at the end of a tube can be used to examine your colon directly (sigmoidoscopy or colonoscopy). This is done to check for the earliest forms of colorectal cancer.  Routine screening usually begins at age 46.  Direct examination of the colon should be repeated every 5-10 years through 46 years of age. However, you may need to be screened more often if early forms of precancerous polyps or small growths are found.  Skin Cancer  Check your skin from head to toe regularly.  Tell your health care provider about any new moles or changes in moles, especially if there is a change in a mole's shape or color.  Also tell your health care provider if you have  a mole that is larger than the size of a pencil eraser.  Always use sunscreen. Apply sunscreen liberally and repeatedly throughout the day.  Protect yourself by wearing long sleeves, pants, a wide-brimmed hat, and sunglasses whenever you are outside.  Heart disease, diabetes, and high blood pressure  High blood pressure causes heart disease and increases the risk of stroke. High blood pressure is more likely to develop in: ? People who have blood pressure in the high end of the normal range (130-139/85-89 mm Hg). ? People who are overweight or obese. ? People who are African American.  If you are 58-90 years of age, have your blood pressure checked every 3-5 years. If you are 25 years of age or older, have your blood pressure checked every year. You should have your blood pressure measured twice-once when you are at a hospital or clinic, and once when you are not at a hospital or clinic. Record the average of the two measurements. To check your blood pressure  when you are not at a hospital or clinic, you can use: ? An automated blood pressure machine at a pharmacy. ? A home blood pressure monitor.  If you are between 4 years and 103 years old, ask your health care provider if you should take aspirin to prevent strokes.  Have regular diabetes screenings. This involves taking a blood sample to check your fasting blood sugar level. ? If you are at a normal weight and have a low risk for diabetes, have this test once every three years after 46 years of age. ? If you are overweight and have a high risk for diabetes, consider being tested at a younger age or more often. Preventing infection Hepatitis B  If you have a higher risk for hepatitis B, you should be screened for this virus. You are considered at high risk for hepatitis B if: ? You were born in a country where hepatitis B is common. Ask your health care provider which countries are considered high risk. ? Your parents were born in a high-risk country, and you have not been immunized against hepatitis B (hepatitis B vaccine). ? You have HIV or AIDS. ? You use needles to inject street drugs. ? You live with someone who has hepatitis B. ? You have had sex with someone who has hepatitis B. ? You get hemodialysis treatment. ? You take certain medicines for conditions, including cancer, organ transplantation, and autoimmune conditions.  Hepatitis C  Blood testing is recommended for: ? Everyone born from [birth year] through 1964. ? Anyone with known risk factors for hepatitis C.  Sexually transmitted infections (STIs)  You should be screened for sexually transmitted infections (STIs) including gonorrhea and chlamydia if: ? You are sexually active and are younger than 46 years of age. ? You are older than 46 years of age and your health care provider tells you that you are at risk for this type of infection. ? Your sexual activity has changed since you were last screened and you are at an increased  risk for chlamydia or gonorrhea. Ask your health care provider if you are at risk.  If you do not have HIV, but are at risk, it may be recommended that you take a prescription medicine daily to prevent HIV infection. This is called pre-exposure prophylaxis (PrEP). You are considered at risk if: ? You are sexually active and do not regularly use condoms or know the HIV status of your partner(s). ? You take drugs by injection. ?  You are sexually active with a partner who has HIV.  Talk with your health care provider about whether you are at high risk of being infected with HIV. If you choose to begin PrEP, you should first be tested for HIV. You should then be tested every 3 months for as long as you are taking PrEP. Pregnancy  If you are premenopausal and you may become pregnant, ask your health care provider about preconception counseling.  If you may become pregnant, take 400 to 800 micrograms (mcg) of folic acid every day.  If you want to prevent pregnancy, talk to your health care provider about birth control (contraception). Osteoporosis and menopause  Osteoporosis is a disease in which the bones lose minerals and strength with aging. This can result in serious bone fractures. Your risk for osteoporosis can be identified using a bone density scan.  If you are 95 years of age or older, or if you are at risk for osteoporosis and fractures, ask your health care provider if you should be screened.  Ask your health care provider whether you should take a calcium or vitamin D supplement to lower your risk for osteoporosis.  Menopause may have certain physical symptoms and risks.  Hormone replacement therapy may reduce some of these symptoms and risks. Talk to your health care provider about whether hormone replacement therapy is right for you. Follow these instructions at home:  Schedule regular health, dental, and eye exams.  Stay current with your immunizations.  Do not use any  tobacco products including cigarettes, chewing tobacco, or electronic cigarettes.  If you are pregnant, do not drink alcohol.  If you are breastfeeding, limit how much and how often you drink alcohol.  Limit alcohol intake to no more than 1 drink per day for nonpregnant women. One drink equals 12 ounces of beer, 5 ounces of wine, or 1 ounces of hard liquor.  Do not use street drugs.  Do not share needles.  Ask your health care provider for help if you need support or information about quitting drugs.  Tell your health care provider if you often feel depressed.  Tell your health care provider if you have ever been abused or do not feel safe at home. This information is not intended to replace advice given to you by your health care provider. Make sure you discuss any questions you have with your health care provider. Document Released: 11/17/2010 Document Revised: 10/10/2015 Document Reviewed: 02/05/2015 Elsevier Interactive Patient Education  [employer].

## 2017-08-27 NOTE — Assessment & Plan Note (Signed)
Re eval lesion on right scapula region. approx.56mm

## 2017-08-27 NOTE — Progress Notes (Signed)
Subjective:    Patient ID: Christy [name], female    DOB: 07/16/1970, 47 y.o.   MRN: [id number]  Patient presents today for complete physical  HPI    Seasonal allergies: chronic, waxing and waning, current use of flonase and zrytec  Chronic hip pain: onset 53yrs ago, bilateral, waxing and waning, worse with prolong sitting and walking on an incline. Has morning stiffness that can last 1hr. Some improvement with tylenol and stretching. Denies any injury or leg weakness or joint swelling or paresthesia. Fhx of OA (mother with hip and knee replacement), no Hx of RA. Denies any childbirth. She will like to hold on any further testing at this time.  Hx of thyroid cancer with s/p thyroidectomy: Managed and monitored by Dr. Letta [name] (endocrinology). Last seen 11/2016). She is seen once a year. Due to new insurance plan she will need another endocrinology referral entered when ready.  Immunizations: (TDAP, Hep C screen, Pneumovax, Influenza, zoster)  Health Maintenance  Topic Date Due  . HIV Screening  08/28/2018*  . Flu Shot  12/16/2017  . Pap Smear  03/11/2019  . Tetanus Vaccine  06/17/2022  *Topic was postponed. The date shown is not the original due date.   Diet:heart healthy.  Weight:  Wt Readings from Last 3 Encounters:  08/27/17 136 lb (61.7 kg)  12/11/16 138 lb (62.6 kg)  12/12/15 142 lb 12.8 oz (64.8 kg)    Exercise:walking and running when hip pain is minimal.  Fall Risk: Fall Risk  08/27/2017  Falls in the past year? No   Home Safety:home with husband.  Depression/Suicide: Depression screen Christy [name] 2/9 08/27/2017 08/28/2014  Decreased Interest 0 0  Down, Depressed, Hopeless 0 0  PHQ - 2 Score 0 0   Pap Smear (every 62yrs for >21-29 without HPV, every 69yrs for >30-87yrs with HPV):up to date, Christy [name], Christy Thongteum, NP-C.  Mammogram: will schedule  Vision:up to date  Dental:up to date  Advanced Directive: Advanced Directives 08/28/2014  Does Patient Have  a Medical Advance Directive? Yes  Type of Advance Directive Living will;Advance instruction for mental health treatment  Ward in Chart? No - copy requested   Sexual History (birth control, marital status, STD):married, sexually active   Medications and allergies reviewed with patient and updated if appropriate.  Patient Active Problem List   Diagnosis Date Noted  . Chronic pain of both hips 08/27/2017  . Skin mole 08/27/2017  . Chronic seasonal allergic rhinitis due to pollen 08/27/2017  . Freckled skin 08/27/2017  . Thyroid cancer ([place]) 10/02/2013  . Iatrogenic hypothyroidism 10/02/2013    Current Outpatient Medications on File Prior to Visit  Medication Sig Dispense Refill  . cetirizine (ZYRTEC) 10 MG tablet Take 1 tablet (10 mg total) by mouth daily. 30 tablet 11  . NATAZIA 3/2-2/2-3/1 MG tablet   2  . SYNTHROID 112 MCG tablet Take 1 tablet (112 mcg total) by mouth daily before breakfast. 45 tablet 5  . Melatonin 3 MG TABS Take by mouth.     No current facility-administered medications on file prior to visit.     Past Medical History:  Diagnosis Date  . Thyroid cancer ([place]) 2004   s/p thyroidectomy     Past Surgical History:  Procedure Laterality Date  . THYROIDECTOMY  2004  . TONSILLECTOMY      Social History   Socioeconomic History  . Marital status: Married    Spouse name: Not on file  . Number of  children: Not on file  . Years of education: Not on file  . Highest education level: Not on file  Occupational History  . Not on file  Social Needs  . Financial resource strain: Not on file  . Food insecurity:    Worry: Not on file    Inability: Not on file  . Transportation needs:    Medical: Not on file    Non-medical: Not on file  Tobacco Use  . Smoking status: Never Smoker  . Smokeless tobacco: Never Used  Substance and Sexual Activity  . Alcohol use: Yes    Comment: social  . Drug use: Never  . Sexual activity: Yes      Birth control/protection: Pill  Lifestyle  . Physical activity:    Days per week: Not on file    Minutes per session: Not on file  . Stress: Not on file  Relationships  . Social connections:    Talks on phone: Not on file    Gets together: Not on file    Attends religious service: Not on file    Active member of club or organization: Not on file    Attends meetings of clubs or organizations: Not on file    Relationship status: Not on file  Other Topics Concern  . Not on file  Social History Narrative  . Not on file    Family History  Problem Relation Age of Onset  . Arthritis Mother   . Colon polyps Mother   . Heart disease Maternal Grandfather   . Heart disease Paternal Grandfather   . Heart disease Father   . Colon polyps Father   . Dementia Maternal Grandmother         ROS  Objective:   Vitals:   08/27/17 1408  BP: 114/78  Pulse: 78  Temp: 98.3 F (36.8 C)  SpO2: 98%    Body mass index is 22.63 kg/m.   Physical Examination:  Physical Exam  Constitutional: She is oriented to person, place, and time. She appears well-developed. No distress.  HENT:  Right Ear: External ear normal.  Left Ear: External ear normal.  Nose: Nose normal.  Mouth/Throat: Oropharynx is clear and moist. No oropharyngeal exudate.  Eyes: Pupils are equal, round, and reactive to light. Conjunctivae and EOM are normal.  Cardiovascular: Normal rate, regular rhythm and normal heart sounds.  Pulmonary/Chest: Effort normal and breath sounds normal. No respiratory distress. She exhibits no tenderness.  Musculoskeletal: Normal range of motion. She exhibits no edema.  Neurological: She is alert and oriented to person, place, and time. She has normal reflexes.  Skin:     Vitals reviewed.   ASSESSMENT and PLAN:  [name] was seen today for establish care.  Diagnoses and all orders for this visit:  Encounter for preventive health examination -     CBC; Future -     Comprehensive  metabolic panel; Future -     Lipid panel; Future -     TSH; Future  Chronic seasonal allergic rhinitis due to pollen -     fluticasone (FLONASE) 50 MCG/ACT nasal spray; Place 2 sprays into both nostrils daily.  Encounter for lipid screening for cardiovascular disease -     Lipid panel; Future  Iatrogenic hypothyroidism  Skin mole  Chronic pain of both hips  Freckled skin   Freckled skin Re eval on left flank region. Approx. 41mm  Skin mole Re eval lesion on right scapula region. approx.70mm  Chronic pain of both hips onset  16yrs ago, bilateral, waxing and waning, worse with prolong sitting and walking on an incline. Has morning stiffness that can last 1hr. Some improvement with tylenol and stretching. Denies any injury or leg weakness or joint swelling or paresthesia. Fhx of OA (mother with hip and knee replacement), no Hx of RA. Denies any childbirth. She wants to wait on any lab orders and/or referral to sports medicine at this time.     Follow up: No follow-ups on file.  Wilfred Lacy, NP

## 2017-08-30 ENCOUNTER — Other Ambulatory Visit (INDEPENDENT_AMBULATORY_CARE_PROVIDER_SITE_OTHER): Payer: No Typology Code available for payment source

## 2017-08-30 DIAGNOSIS — Z0000 Encounter for general adult medical examination without abnormal findings: Secondary | ICD-10-CM | POA: Diagnosis not present

## 2017-08-30 DIAGNOSIS — Z13220 Encounter for screening for lipoid disorders: Secondary | ICD-10-CM

## 2017-08-30 DIAGNOSIS — Z136 Encounter for screening for cardiovascular disorders: Secondary | ICD-10-CM | POA: Diagnosis not present

## 2017-08-30 LAB — COMPREHENSIVE METABOLIC PANEL
ALBUMIN: 4.1 g/dL (ref 3.5–5.2)
ALT: 6 U/L (ref 0–35)
AST: 13 U/L (ref 0–37)
Alkaline Phosphatase: 42 U/L (ref 39–117)
BUN: 13 mg/dL (ref 6–23)
CHLORIDE: 107 meq/L (ref 96–112)
CO2: 25 mEq/L (ref 19–32)
Calcium: 9.5 mg/dL (ref 8.4–10.5)
Creatinine, Ser: 0.7 mg/dL (ref 0.40–1.20)
GFR: 95.7 mL/min (ref >60.00)
Glucose, Bld: 87 mg/dL (ref 70–99)
POTASSIUM: 4.8 meq/L (ref 3.5–5.1)
SODIUM: 141 meq/L (ref 135–145)
Total Bilirubin: 0.5 mg/dL (ref 0.2–1.2)
Total Protein: 7 g/dL (ref 6.0–8.3)

## 2017-08-30 LAB — CBC
HEMATOCRIT: 39.2 % (ref 36.0–46.0)
Hemoglobin: 13.2 g/dL (ref 12.0–15.0)
MCHC: 33.7 g/dL (ref 30.0–36.0)
MCV: 93.2 fl (ref 78.0–100.0)
PLATELETS: 289 10*3/uL (ref 150.0–400.0)
RBC: 4.21 Mil/uL (ref 3.87–5.11)
RDW: 13.3 % (ref 11.5–15.5)
WBC: 5.5 10*3/uL (ref 4.0–10.5)

## 2017-08-30 LAB — LIPID PANEL
CHOLESTEROL: 179 mg/dL (ref 0–200)
HDL: 82 mg/dL (ref >39.00)
LDL Cholesterol: 78 mg/dL (ref 0–99)
NONHDL: 97.49
Total CHOL/HDL Ratio: 2
Triglycerides: 95 mg/dL (ref 0.0–149.0)
VLDL: 19 mg/dL (ref 0.0–40.0)

## 2017-08-30 LAB — TSH: TSH: 1.84 u[IU]/mL (ref 0.35–4.50)

## 2017-09-06 MED FILL — FLUTICASONE PROP 50 MCG SPR: 50 | 90 days supply | Qty: 48 | Fill #0

## 2017-10-18 MED FILL — SYNTHROID 112 MCG TABLET: 112 | 75 days supply | Qty: 75 | Fill #3

## 2017-11-04 ENCOUNTER — Encounter: Payer: Self-pay | Admitting: Nurse Practitioner

## 2017-11-05 ENCOUNTER — Other Ambulatory Visit: Payer: Self-pay | Admitting: Nurse Practitioner

## 2017-11-05 ENCOUNTER — Other Ambulatory Visit: Payer: Self-pay | Admitting: Family Medicine

## 2017-11-05 DIAGNOSIS — E039 Hypothyroidism, unspecified: Secondary | ICD-10-CM

## 2017-11-05 DIAGNOSIS — Z1231 Encounter for screening mammogram for malignant neoplasm of breast: Secondary | ICD-10-CM

## 2017-11-05 NOTE — Telephone Encounter (Signed)
Please advise, pt has been seeing Dr. Cruzita Lederer since 2015 (est care with [place] 08/2017). Seem like her insurance changed and its require referral from her PCP now.

## 2017-11-08 MED FILL — NATAZIA 28 TABLET: 3/2-2/2-3/1 | 84 days supply | Qty: 84 | Fill #3

## 2017-11-29 ENCOUNTER — Ambulatory Visit
Admission: RE | Admit: 2017-11-29 | Discharge: 2017-11-29 | Disposition: A | Discharge status: Home / Self Care | Payer: No Typology Code available for payment source | Source: Ambulatory Visit | Attending: Nurse Practitioner | Admitting: Nurse Practitioner

## 2017-11-29 DIAGNOSIS — Z1231 Encounter for screening mammogram for malignant neoplasm of breast: Secondary | ICD-10-CM

## 2017-12-10 ENCOUNTER — Encounter: Payer: Self-pay | Admitting: Internal Medicine

## 2017-12-10 ENCOUNTER — Ambulatory Visit (INDEPENDENT_AMBULATORY_CARE_PROVIDER_SITE_OTHER): Payer: No Typology Code available for payment source | Admitting: Internal Medicine

## 2017-12-10 VITALS — BP 122/80 | HR 77 | Ht 65.0 in | Wt 138.2 lb

## 2017-12-10 DIAGNOSIS — E032 Hypothyroidism due to medicaments and other exogenous substances: Secondary | ICD-10-CM

## 2017-12-10 DIAGNOSIS — C73 Malignant neoplasm of thyroid gland: Secondary | ICD-10-CM

## 2017-12-10 LAB — TSH: TSH: 0.67 u[IU]/mL (ref 0.35–4.50)

## 2017-12-10 LAB — T4, FREE: FREE T4: 1.34 ng/dL (ref 0.60–1.60)

## 2017-12-10 NOTE — Progress Notes (Signed)
Patient ID: ZAKYLA TONCHE, female   DOB: Jul 27, 1970, 47 y.o.   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 She was seen by Dr Chalmers Cater in the past.  Last visit with me one year ago.  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 Almetta Lovely note, stimulated Tg was 2.9 08/10/2008 Thyrogen stimulated WBS negative 10/15/2009 ultrasound neck with 3 abnormal cervical lymph nodes 11/08/2009 FNA of one of the lymph node that was found to be without fatty hilum: negative for metastases - she had a traumatic experience then and would not want to repeat this 09/26/2010 Thyrogen stimulated WBS negative except uptake in colon 10/07/2012 Thyrogen stimulated WBS negative  ! Patient has a Thyrogen allergy >> needs pretreatment with steroids 10/02/2013: Tg 0.2, ATA <1 10/16/2013: Neck U/S:  Small area of irregular tissue in the left thyroid bed measures roughly 1 cm in length and 0.5 cm in width. This is very ill-defined appearing and may represent scar tissue. Thyroid carcinoma recurrence cannot be completely excluded. Prior whole body nuclear medicine study was negative for any thyroid activity in the neck. No abnormal tissue is identified in the right thyroid bed. No abnormal lymph nodes are identified.  Per Dr. Almetta Lovely note, patient had many WBS's and a PET scan in the past as she had positive stimulated thyroglobulin, without an identified Thyroid tissue  focus. 09/05/2014: CT neck and chest: 1. Negative neck CT status post thyroidectomy. No soft tissue mass or lymphadenopathy corresponding to the right supraclavicular palpable area of concern, or elsewhere in the neck. 2. Negative chest CT.  No acute or metastatic process identified. 12/12/2014: TSH 1.45, 12/25/2014: ATA <1, Tg(RIA) <2.0  I reviewed patient's TFTs per records from Dr Chalmers Cater.: 07/14/2006: Tg 1, ATA 3.6 03/2007: Tg <0.5, Stimulated Tg 2.7 12/2007: Tg <0.5, TSH 0.1 2013: Tg <0.5, ATA <20 09/16/2012: TSH 0.644, Tg 0.1, ATA <1 10/07/2012: Tg 2.0, ATA <1 06/28/2013: TSH 1.1   More recent labs: Lab Results  Component Value Date   TSH 1.84 08/30/2017   TSH 1.43 04/23/2017   TSH 0.21 (L) 02/19/2017   TSH 0.22 (L) 12/11/2016   TSH 1.31 12/12/2015   TSH 1.45 12/12/2014   TSH 0.96 10/02/2013   FREET4 1.28 04/23/2017   FREET4 1.53 02/19/2017   FREET4 1.42 12/11/2016   FREET4 1.30 12/12/2015   FREET4 1.20 12/12/2014   FREET4 1.52 10/02/2013   Lab Results  Component Value Date   THYROGLB <0.1 (L) 12/11/2016   THYROGLB 0.2 (L) 12/12/2015   THYROGLB <2.0 12/25/2014   THGAB <1.0 12/11/2016   THGAB <1.0 12/12/2015   THGAB <1.0 12/25/2014   Pt is on Synthroid d.a.w. 112 Mcg daily (decreased from 125 mcg after last visit), taken: - in am - fasting - at least 30 min from b'fast - no Ca, Fe, MVI, PPIs - not on Biotin  Pt denies: - feeling nodules in neck - hoarseness - dysphagia - choking - SOB with lying down  Also, she denies denies: - Weight gain - Fatigue - Cold intolerance - Constipation -  Hair loss  ROS: Constitutional: + See HPI Eyes: no blurry vision, no xerophthalmia ENT: no sore throat, + see HPI Cardiovascular: no CP/no SOB/no palpitations/no leg swelling Respiratory: no cough/no SOB/no wheezing Gastrointestinal: no N/no V/no D/no C/no acid reflux Musculoskeletal: + muscle aches/+ joint aches Skin: no rashes, no hair loss Neurological: no  tremors/no numbness/no tingling/no dizziness  I reviewed pt's medications, allergies, PMH, social hx, family hx, and changes were documented in the history of present illness. Otherwise, unchanged from my initial visit note.  Past Medical History:  Diagnosis Date  . Thyroid cancer ([place]) 2004   s/p thyroidectomy    History   Social History  . Marital Status: Married    Spouse Name: N/A    Number of Children: 2   Occupational History  . Systems analyst - [employer]   Social History Main Topics  . Smoking status: Never Smoker   . Smokeless tobacco: No  . Alcohol Use: Occasional: wine, beer  . Drug Use: No  Exercise: walk/run - daily   Current Outpatient Medications on File Prior to Visit  Medication Sig Dispense Refill  . cetirizine (ZYRTEC) 10 MG tablet Take 1 tablet (10 mg total) by mouth daily. 30 tablet 11  . fluticasone (FLONASE) 50 MCG/ACT nasal spray Place 2 sprays into both nostrils daily. 16 g 11  . Melatonin 3 MG TABS Take by mouth.    Marland Kitchen Christy [name] 3/2-2/2-3/1 MG tablet   2  . SYNTHROID 112 MCG tablet Take 1 tablet (112 mcg total) by mouth daily before breakfast. 45 tablet 5   No current facility-administered medications on file prior to visit.    Allergies  Allergen Reactions  . Neosporin [Neomycin-Bacitracin Zn-Polymyx]   . Thyrogen [Thyrotropin] Other (See Comments)    Thyrogen allergy >> needs pretreatment with steroids   FH: H/o heart ds. in father  PE: BP 122/80   Pulse 77   Ht 5\' 5"  (1.651 m)   Wt 138 lb 3.2 oz (62.7 kg)   LMP 11/20/2017   SpO2 99%   BMI 23.00 kg/m  Wt Readings from Last 3 Encounters:  12/10/17 138 lb 3.2 oz (62.7 kg)  08/27/17 136 lb (61.7 kg)  12/11/16 138 lb (62.6 kg)   Constitutional: overweight, in NAD Eyes: PERRLA, EOMI, no exophthalmos ENT: moist mucous membranes, + thyroidectomy scar healed, no neck masses palpated y, no cervical lymphadenopathy Cardiovascular: RRR, No MRG Respiratory: CTA B Gastrointestinal: abdomen  soft, NT, ND, BS+ Musculoskeletal: no deformities, strength intact in all 4 Skin: moist, warm, no rashes Neurological: no tremor with outstretched hands, DTR normal in all 4  ASSESSMENT: 1. Follicular thyroid cancer - Thyroid U/S (10/16/2013): Small area of irregular tissue in the left thyroid bed measures roughly 1 cm in length and 0.5 cm in width. This is very ill-defined appearing and may represent scar tissue. Thyroid carcinoma recurrence cannot be completely excluded. Prior whole body nuclear medicine study was negative for any thyroid activity in the neck. No abnormal tissue is identified in the right thyroid bed. No abnormal lymph nodes are identified. - CT neck and chest (09/05/2014):  1. Negative neck CT status post thyroidectomy. No soft tissue mass or lymphadenopathy corresponding to the right supraclavicular palpable area of concern, or elsewhere in the neck. 2. Negative chest CT.  No acute or metastatic process identified.  2. Postsurgical Hypothyroidism  PLAN:  1. Follicular ThyCa - in remission -She has a history of detectable thyroglobulin levels, but very low, alternating with undetectable levels.  I believe that this is due to the thyroglobulin assay.  Her antithyroglobulin antibodies are negative, but they have been positive in 2008. - At last visit, her globulin level was undetectable, with undetectable ATA antibodies - Reviewed together her latest neck ultrasound report from 10/2013: Scar tissue versus thyroid remnant in thyroid bed, but this has been present since her surgery per records from Dr. Chalmers Cater.  This was the reason for many whole body scans and the PET scan that she had in the past.  She had a chest and neck CT in 2016 (with contrast) which was negative for metastases or recurrences. - At this visit, she has no neck compression symptoms  - we will check Tg + ATA - we will need to check another neck ultrasound if thyroglobulin starts to increase  2.  Postsurgical hypothyroidism, on Synthroid therapy - latest thyroid labs reviewed with pt >> normal  - she continues on Synthroid d.a.w. 112 Mcg daily - pt feels good on this dose. - we discussed about taking the thyroid hormone every day, with water, >30 minutes before breakfast, separated by >4 hours from acid reflux medications, calcium, iron, multivitamins. Pt. is taking it correctly. - will check thyroid tests today: TSH and fT4 - If labs are abnormal, she will need to return for repeat TFTs in 1.5 months - Otherwise, I will see her back in a year  Needs refills - [facility].  - time spent with the patient: 25 minutes, of which >50% was spent in obtaining information about her symptoms, reviewing her previous labs, evaluations, and treatments, counseling her about her thyroid cancer and her hypothyroidism (please see the discussed topics above), and developing a plan to further investigate and treat them.  Component     Latest Ref Rng & Units 12/10/2017  Thyroglobulin     ng/mL 0.1 (L)  Comment        TSH     0.35 - 4.50 uIU/mL 0.67  T4,Free(Direct)     0.60 - 1.60 ng/dL 1.34  Thyroglobulin Ab     < or = 1 IU/mL <1  Thyroid tests are normal.  Thyroglobulin is suppressed, but not quite undetectable.  It has been fluctuating in the past.  We will continue to follow her expectantly, by another check in a year.  Philemon Kingdom, MD PhD [facility] Endocrinology

## 2017-12-10 NOTE — Patient Instructions (Addendum)
Please stop at the lab.  If you start Biotin, stop 1 week before labs.  Please continue the current Synthroid dose (112 mcg) daily.  Take the thyroid hormone every day, with water, at least 30 minutes before breakfast, separated by at least 4 hours from: - acid reflux medications - calcium - iron - multivitamins  Please return in 1 year.

## 2017-12-13 LAB — THYROGLOBULIN ANTIBODY: Thyroglobulin Ab: <1 IU/mL (ref <=1)

## 2017-12-13 LAB — THYROGLOBULIN LEVEL: Thyroglobulin: 0.1 ng/mL — ABNORMAL LOW

## 2017-12-13 MED ORDER — SYNTHROID 112 MCG PO TABS: 112.0000 ug | ORAL_TABLET | Freq: Every day | ORAL | 3 refills | Status: DC

## 2017-12-13 MED FILL — FLUTICASONE PROP 50 MCG SPR: 50 | 90 days supply | Qty: 48 | Fill #1

## 2017-12-14 MED FILL — SYNTHROID 112 MCG TABLET: 112 | 90 days supply | Qty: 90 | Fill #0

## 2018-02-04 MED FILL — NATAZIA 28 TABLET: 3/2-2/2-3/1 | 84 days supply | Qty: 84 | Fill #0

## 2018-03-07 MED FILL — SYNTHROID 112 MCG TABLET: 112 | 90 days supply | Qty: 90 | Fill #1

## 2018-03-07 MED FILL — FLUTICASONE PROP 50 MCG SPR: 50 | 90 days supply | Qty: 48 | Fill #2

## 2018-03-16 ENCOUNTER — Ambulatory Visit (INDEPENDENT_AMBULATORY_CARE_PROVIDER_SITE_OTHER): Payer: No Typology Code available for payment source | Admitting: Nurse Practitioner

## 2018-03-16 ENCOUNTER — Encounter: Payer: Self-pay | Admitting: Nurse Practitioner

## 2018-03-16 VITALS — BP 130/82 | HR 95 | Temp 98.9°F | Ht 65.0 in | Wt 138.0 lb

## 2018-03-16 DIAGNOSIS — J0140 Acute pansinusitis, unspecified: Secondary | ICD-10-CM

## 2018-03-16 DIAGNOSIS — J040 Acute laryngitis: Secondary | ICD-10-CM | POA: Diagnosis not present

## 2018-03-16 DIAGNOSIS — J209 Acute bronchitis, unspecified: Secondary | ICD-10-CM

## 2018-03-16 MED ORDER — METHYLPREDNISOLONE 4 MG PO TBPK: ORAL_TABLET | ORAL | 0 refills | Status: DC

## 2018-03-16 MED ORDER — AZITHROMYCIN 250 MG PO TABS: 250.0000 mg | ORAL_TABLET | Freq: Every day | ORAL | 0 refills | Status: DC

## 2018-03-16 MED ORDER — METHYLPREDNISOLONE ACETATE 40 MG/ML IJ SUSP
40.0000 mg | Freq: Once | INTRAMUSCULAR | Status: AC
  Administered 2018-03-16: 40 mg via INTRAMUSCULAR

## 2018-03-16 MED ORDER — HYDROCODONE-HOMATROPINE 5-1.5 MG/5ML PO SYRP: 5.0000 mL | ORAL_SOLUTION | Freq: Three times a day (TID) | ORAL | 0 refills | Status: DC | PRN

## 2018-03-16 MED ORDER — ALBUTEROL SULFATE (2.5 MG/3ML) 0.083% IN NEBU
2.5000 mg | INHALATION_SOLUTION | Freq: Once | RESPIRATORY_TRACT | Status: AC
  Administered 2018-03-16: 2.5 mg via RESPIRATORY_TRACT

## 2018-03-16 MED ORDER — DM-GUAIFENESIN ER 30-600 MG PO TB12: 1.0000 | ORAL_TABLET | Freq: Two times a day (BID) | ORAL | 0 refills | Status: DC | PRN

## 2018-03-16 MED ORDER — ALBUTEROL SULFATE HFA 108 (90 BASE) MCG/ACT IN AERS: 1.0000 | INHALATION_SPRAY | Freq: Four times a day (QID) | RESPIRATORY_TRACT | 0 refills | Status: DC | PRN

## 2018-03-16 MED FILL — HYDROCODONE-HOMATROPINE SYR: 5-1.5 | 7 days supply | Qty: 100 | Fill #0

## 2018-03-16 MED FILL — METHYLPREDNISOLONE 4 MG TAB: 4 | 6 days supply | Qty: 21 | Fill #0

## 2018-03-16 MED FILL — VENTOLIN HFA 90 MCG INHALER: 108 (90 BAS | 25 days supply | Qty: 18 | Fill #0

## 2018-03-16 MED FILL — AZITHROMYCIN 250 MG TABLET: 250 | 5 days supply | Qty: 6 | Fill #0

## 2018-03-16 NOTE — Progress Notes (Signed)
Subjective:  Patient ID: Christy [name], female    DOB: 30-Dec-1970  Age: 47 y.o. MRN: [id number]  CC: Cough (pt c/o of coughing thick mucus,loss voice,congestion,cant sleep/ fatigue.going on 6 days/dylsym,robitussin, cold cough med. otc. )  Cough  This is a new problem. The current episode started in the past 7 days. The problem has been gradually worsening. The problem occurs constantly. The cough is productive of sputum. Associated symptoms include chest pain, ear congestion, ear pain, nasal congestion, postnasal drip, rhinorrhea, a sore throat and shortness of breath. Pertinent negatives include no chills, fever, headaches, heartburn or wheezing. The symptoms are aggravated by cold air and lying down. She has tried OTC cough suppressant for the symptoms. The treatment provided no relief. Christy [name] past medical history is significant for environmental allergies.   Reviewed past Medical, Social and Family history today.  Outpatient Medications Prior to Visit  Medication Sig Dispense Refill  . cetirizine (ZYRTEC) 10 MG tablet Take 1 tablet (10 mg total) by mouth daily. 30 tablet 11  . fluticasone (FLONASE) 50 MCG/ACT nasal spray Place 2 sprays into both nostrils daily. 16 g 11  . Melatonin 3 MG TABS Take by mouth.    Marland Kitchen NATAZIA 3/2-2/2-3/1 MG tablet   2  . SYNTHROID 112 MCG tablet Take 1 tablet (112 mcg total) by mouth daily before breakfast. 90 tablet 3   No facility-administered medications prior to visit.     ROS See HPI  Objective:  BP 130/82   Pulse 95   Temp 98.9 F (37.2 C) (Oral)   Ht 5\' 5"  (1.651 m)   Wt 138 lb (62.6 kg)   SpO2 98%   BMI 22.96 kg/m   BP Readings from Last 3 Encounters:  03/16/18 130/82  12/10/17 122/80  08/27/17 114/78    Wt Readings from Last 3 Encounters:  03/16/18 138 lb (62.6 kg)  12/10/17 138 lb 3.2 oz (62.7 kg)  08/27/17 136 lb (61.7 kg)    Physical Exam  Constitutional: She appears well-developed and well-nourished.  HENT:  Right Ear:  Tympanic membrane, external ear and ear canal normal.  Left Ear: Tympanic membrane, external ear and ear canal normal.  Nose: Mucosal edema and rhinorrhea present. Right sinus exhibits maxillary sinus tenderness. Left sinus exhibits maxillary sinus tenderness.  Mouth/Throat: Uvula is midline. Posterior oropharyngeal erythema present. No oropharyngeal exudate or posterior oropharyngeal edema.  Neck: Neck supple.  Cardiovascular: Normal rate, regular rhythm and normal heart sounds.  Pulmonary/Chest: Effort normal. She has wheezes. She has no rales.  Skin: No rash noted.  Vitals reviewed.   Lab Results  Component Value Date   WBC 5.5 08/30/2017   HGB 13.2 08/30/2017   HCT 39.2 08/30/2017   PLT 289.0 08/30/2017   GLUCOSE 87 08/30/2017   CHOL 179 08/30/2017   TRIG 95.0 08/30/2017   HDL 82.00 08/30/2017   LDLCALC 78 08/30/2017   ALT 6 08/30/2017   AST 13 08/30/2017   NA 141 08/30/2017   K 4.8 08/30/2017   CL 107 08/30/2017   CREATININE 0.70 08/30/2017   BUN 13 08/30/2017   CO2 25 08/30/2017   TSH 0.67 12/10/2017   INR 0.99 11/08/2009    Mm 3d Screen Breast Bilateral  Result Date: 11/30/2017 CLINICAL DATA:  Screening. EXAM: DIGITAL SCREENING BILATERAL MAMMOGRAM WITH TOMO AND CAD COMPARISON:  Previous exam(s). ACR Breast Density Category d: The breast tissue is extremely dense, which lowers the sensitivity of mammography. FINDINGS: There are no findings suspicious for malignancy. Images were  processed with CAD. IMPRESSION: No mammographic evidence of malignancy. A result letter of this screening mammogram will be mailed directly to the patient. RECOMMENDATION: Screening mammogram in one year. (Code:SM-B-01Y) BI-RADS CATEGORY  1: Negative. Electronically Signed   By: Lajean Manes M.D.   On: 11/30/2017 10:14    Assessment & Plan:   Christy [name] was seen today for cough.  Diagnoses and all orders for this visit:  Laryngitis -     methylPREDNISolone acetate (DEPO-MEDROL) injection 40 mg -      methylPREDNISolone (MEDROL DOSEPAK) 4 MG TBPK tablet; Take as directed on package -     dextromethorphan-guaiFENesin (MUCINEX DM) 30-600 MG 12hr tablet; Take 1 tablet by mouth 2 (two) times daily as needed for cough. -     HYDROcodone-homatropine (HYCODAN) 5-1.5 MG/5ML syrup; Take 5 mLs by mouth every 8 (eight) hours as needed for cough. -     azithromycin (ZITHROMAX Z-PAK) 250 MG tablet; Take 1 tablet (250 mg total) by mouth daily. Take 2tabs on first day, then 1tab once a day till complete  Acute bronchitis, unspecified organism -     methylPREDNISolone acetate (DEPO-MEDROL) injection 40 mg -     methylPREDNISolone (MEDROL DOSEPAK) 4 MG TBPK tablet; Take as directed on package -     dextromethorphan-guaiFENesin (MUCINEX DM) 30-600 MG 12hr tablet; Take 1 tablet by mouth 2 (two) times daily as needed for cough. -     HYDROcodone-homatropine (HYCODAN) 5-1.5 MG/5ML syrup; Take 5 mLs by mouth every 8 (eight) hours as needed for cough. -     azithromycin (ZITHROMAX Z-PAK) 250 MG tablet; Take 1 tablet (250 mg total) by mouth daily. Take 2tabs on first day, then 1tab once a day till complete -     albuterol (PROVENTIL HFA;VENTOLIN HFA) 108 (90 Base) MCG/ACT inhaler; Inhale 1-2 puffs into the lungs every 6 (six) hours as needed. -     albuterol (PROVENTIL) (2.5 MG/3ML) 0.083% nebulizer solution 2.5 mg  Acute non-recurrent pansinusitis -     methylPREDNISolone acetate (DEPO-MEDROL) injection 40 mg -     methylPREDNISolone (MEDROL DOSEPAK) 4 MG TBPK tablet; Take as directed on package -     dextromethorphan-guaiFENesin (MUCINEX DM) 30-600 MG 12hr tablet; Take 1 tablet by mouth 2 (two) times daily as needed for cough. -     HYDROcodone-homatropine (HYCODAN) 5-1.5 MG/5ML syrup; Take 5 mLs by mouth every 8 (eight) hours as needed for cough. -     azithromycin (ZITHROMAX Z-PAK) 250 MG tablet; Take 1 tablet (250 mg total) by mouth daily. Take 2tabs on first day, then 1tab once a day till complete   I am  having Christy [name] start on methylPREDNISolone, dextromethorphan-guaiFENesin, HYDROcodone-homatropine, azithromycin, and albuterol. I am also having Christy [name] maintain Christy [name] Melatonin, cetirizine, NATAZIA, fluticasone, and SYNTHROID. We will continue to administer methylPREDNISolone acetate and albuterol.  Meds ordered this encounter  Medications  . methylPREDNISolone acetate (DEPO-MEDROL) injection 40 mg  . methylPREDNISolone (MEDROL DOSEPAK) 4 MG TBPK tablet    Sig: Take as directed on package    Dispense:  21 tablet    Refill:  0    Order Specific Question:   Supervising Provider    Answer:   Lucille Passy [[id number]]  . dextromethorphan-guaiFENesin (MUCINEX DM) 30-600 MG 12hr tablet    Sig: Take 1 tablet by mouth 2 (two) times daily as needed for cough.    Dispense:  14 tablet    Refill:  0    Order Specific Question:   Supervising  Provider    Answer:   Lucille Passy [[id number]]  . HYDROcodone-homatropine (HYCODAN) 5-1.5 MG/5ML syrup    Sig: Take 5 mLs by mouth every 8 (eight) hours as needed for cough.    Dispense:  100 mL    Refill:  0    Order Specific Question:   Supervising Provider    Answer:   Lucille Passy [[id number]]  . azithromycin (ZITHROMAX Z-PAK) 250 MG tablet    Sig: Take 1 tablet (250 mg total) by mouth daily. Take 2tabs on first day, then 1tab once a day till complete    Dispense:  6 tablet    Refill:  0    Order Specific Question:   Supervising Provider    Answer:   Lucille Passy [[id number]]  . albuterol (PROVENTIL HFA;VENTOLIN HFA) 108 (90 Base) MCG/ACT inhaler    Sig: Inhale 1-2 puffs into the lungs every 6 (six) hours as needed.    Dispense:  1 Inhaler    Refill:  0    Order Specific Question:   Supervising Provider    Answer:   Lucille Passy [[id number]]  . albuterol (PROVENTIL) (2.5 MG/3ML) 0.083% nebulizer solution 2.5 mg    Follow-up: Return if symptoms worsen or fail to improve.  Wilfred Lacy, NP

## 2018-03-16 NOTE — Patient Instructions (Addendum)
Maintain adequate oral hydration.  Start oral prednisone tomorrow   Laryngitis Laryngitis is inflammation of your vocal cords. This causes hoarseness, coughing, loss of voice, sore throat, or a dry throat. Your vocal cords are two bands of muscles that are found in your throat. When you speak, these cords come together and vibrate. These vibrations come out through your mouth as sound. When your vocal cords are inflamed, your voice sounds different. Laryngitis can be temporary (acute) or long-term (chronic). Most cases of acute laryngitis improve with time. Chronic laryngitis is laryngitis that lasts for more than three weeks. What are the causes? Acute laryngitis may be caused by:  A viral infection.  Lots of talking, yelling, or singing. This is also called vocal strain.  Bacterial infections.  Chronic laryngitis may be caused by:  Vocal strain.  Injury to your vocal cords.  Acid reflux (gastroesophageal reflux disease or GERD).  Allergies.  Sinus infection.  Smoking.  Alcohol abuse.  Breathing in chemicals or dust.  Growths on the vocal cords.  What increases the risk? Risk factors for laryngitis include:  Smoking.  Alcohol abuse.  Having allergies.  What are the signs or symptoms? Symptoms of laryngitis may include:  Low, hoarse voice.  Loss of voice.  Dry cough.  Sore throat.  Stuffy nose.  How is this diagnosed? Laryngitis may be diagnosed by:  Physical exam.  Throat culture.  Blood test.  Laryngoscopy. This procedure allows your health care provider to look at your vocal cords with a mirror or viewing tube.  How is this treated? Treatment for laryngitis depends on what is causing it. Usually, treatment involves resting your voice and using medicines to soothe your throat. However, if your laryngitis is caused by a bacterial infection, you may need to take antibiotic medicine. If your laryngitis is caused by a growth, you may need to have a  procedure to remove it. Follow these instructions at home:  Drink enough fluid to keep your urine clear or pale yellow.  Breathe in moist air. Use a humidifier if you live in a dry climate.  Take medicines only as directed by your health care provider.  If you were prescribed an antibiotic medicine, finish it all even if you start to feel better.  Do not smoke cigarettes or electronic cigarettes. If you need help quitting, ask your health care provider.  Talk as little as possible. Also avoid whispering, which can cause vocal strain.  Write instead of talking. Do this until your voice is back to normal. Contact a health care provider if:  You have a fever.  You have increasing pain.  You have difficulty swallowing. Get help right away if:  You cough up blood.  You have trouble breathing. This information is not intended to replace advice given to you by your health care provider. Make sure you discuss any questions you have with your health care provider. Document Released: 05/04/2005 Document Revised: 10/10/2015 Document Reviewed: 10/17/2013 Elsevier Interactive Patient Education  [employer].

## 2018-05-03 MED FILL — NATAZIA 28 TABLET: 3/2-2/2-3/1 | 84 days supply | Qty: 84 | Fill #0

## 2018-06-01 MED FILL — SYNTHROID 112 MCG TABLET: 112 | 90 days supply | Qty: 90 | Fill #2

## 2018-06-01 MED FILL — FLUTICASONE PROP 50 MCG SPR: 50 | 90 days supply | Qty: 48 | Fill #3

## 2018-06-08 ENCOUNTER — Encounter: Payer: Self-pay | Admitting: Nurse Practitioner

## 2018-06-08 DIAGNOSIS — M25551 Pain in right hip: Principal | ICD-10-CM

## 2018-06-08 DIAGNOSIS — M25552 Pain in left hip: Principal | ICD-10-CM

## 2018-06-08 DIAGNOSIS — G8929 Other chronic pain: Secondary | ICD-10-CM

## 2018-06-13 ENCOUNTER — Encounter: Payer: Self-pay | Admitting: Nurse Practitioner

## 2018-06-13 ENCOUNTER — Ambulatory Visit (INDEPENDENT_AMBULATORY_CARE_PROVIDER_SITE_OTHER): Payer: No Typology Code available for payment source | Admitting: Nurse Practitioner

## 2018-06-13 VITALS — BP 116/82 | HR 77 | Temp 98.7°F | Ht 65.0 in | Wt 138.4 lb

## 2018-06-13 DIAGNOSIS — D229 Melanocytic nevi, unspecified: Secondary | ICD-10-CM

## 2018-06-13 NOTE — Assessment & Plan Note (Signed)
Hx of thyroid cancer with s/p thyroidectomy: Managed and monitored by Dr. Letta Median (endocrinology). Last seen 11/2016). She is seen once a year. Due to new insurance plan she will need another endocrinology referral entered when ready.

## 2018-06-13 NOTE — Assessment & Plan Note (Addendum)
No change in lesion on right scapula region and left lower back. New lesion on right check (flesh color, symmetrical). Entered referral to dermatology.

## 2018-06-13 NOTE — Progress Notes (Signed)
Subjective:  Patient ID: Christy [name], female    DOB: [birth year]  Age: 48 y.o. MRN: [id number]  CC: Nevus (follow up on moles,new one on right side on nose,denied pain. )  Rash  This is a chronic problem. The current episode started more than 1 year ago. The problem has been gradually worsening since onset. The affected locations include the face and back. Rash characteristics: chnage in size. It is unknown if there was an exposure to a precipitant. Pertinent negatives include no fatigue, fever or joint pain. Past treatments include nothing.  she is concerned about lesion on nose due to change in size. She wears facial moisturizer with sunscreen.  Reviewed past Medical, Social and Family history today.  Outpatient Medications Prior to Visit  Medication Sig Dispense Refill  . cetirizine (ZYRTEC) 10 MG tablet Take 1 tablet (10 mg total) by mouth daily. 30 tablet 11  . fluticasone (FLONASE) 50 MCG/ACT nasal spray Place 2 sprays into both nostrils daily. 16 g 11  . Melatonin 3 MG TABS Take by mouth.    Marland Kitchen NATAZIA 3/2-2/2-3/1 MG tablet   2  . SYNTHROID 112 MCG tablet Take 1 tablet (112 mcg total) by mouth daily before breakfast. 90 tablet 3  . albuterol (PROVENTIL HFA;VENTOLIN HFA) 108 (90 Base) MCG/ACT inhaler Inhale 1-2 puffs into the lungs every 6 (six) hours as needed. 1 Inhaler 0  . azithromycin (ZITHROMAX Z-PAK) 250 MG tablet Take 1 tablet (250 mg total) by mouth daily. Take 2tabs on first day, then 1tab once a day till complete 6 tablet 0  . dextromethorphan-guaiFENesin (MUCINEX DM) 30-600 MG 12hr tablet Take 1 tablet by mouth 2 (two) times daily as needed for cough. 14 tablet 0  . HYDROcodone-homatropine (HYCODAN) 5-1.5 MG/5ML syrup Take 5 mLs by mouth every 8 (eight) hours as needed for cough. 100 mL 0  . methylPREDNISolone (MEDROL DOSEPAK) 4 MG TBPK tablet Take as directed on package 21 tablet 0   No facility-administered medications prior to visit.     ROS See  HPI  Objective:  BP 116/82   Pulse 77   Temp 98.7 F (37.1 C) (Oral)   Ht 5\' 5"  (1.651 m)   Wt 138 lb 6.4 oz (62.8 kg)   SpO2 99%   BMI 23.03 kg/m   BP Readings from Last 3 Encounters:  06/13/18 116/82  03/16/18 130/82  12/10/17 122/80    Wt Readings from Last 3 Encounters:  06/13/18 138 lb 6.4 oz (62.8 kg)  03/16/18 138 lb (62.6 kg)  12/10/17 138 lb 3.2 oz (62.7 kg)    Physical Exam HENT:     Head:   Skin:    Findings: Lesion present. No erythema.          Lab Results  Component Value Date   WBC 5.5 08/30/2017   HGB 13.2 08/30/2017   HCT 39.2 08/30/2017   PLT 289.0 08/30/2017   GLUCOSE 87 08/30/2017   CHOL 179 08/30/2017   TRIG 95.0 08/30/2017   HDL 82.00 08/30/2017   LDLCALC 78 08/30/2017   ALT 6 08/30/2017   AST 13 08/30/2017   NA 141 08/30/2017   K 4.8 08/30/2017   CL 107 08/30/2017   CREATININE 0.70 08/30/2017   BUN 13 08/30/2017   CO2 25 08/30/2017   TSH 0.67 12/10/2017   INR 0.99 11/08/2009    Mm 3d Screen Breast Bilateral  Result Date: 11/30/2017 CLINICAL DATA:  Screening. EXAM: DIGITAL SCREENING BILATERAL MAMMOGRAM WITH TOMO AND CAD COMPARISON:  Previous exam(s). ACR Breast Density Category d: The breast tissue is extremely dense, which lowers the sensitivity of mammography. FINDINGS: There are no findings suspicious for malignancy. Images were processed with CAD. IMPRESSION: No mammographic evidence of malignancy. A result letter of this screening mammogram will be mailed directly to the patient. RECOMMENDATION: Screening mammogram in one year. (Code:SM-B-01Y) BI-RADS CATEGORY  1: Negative. Electronically Signed   By: Lajean Manes M.D.   On: 11/30/2017 10:14    Assessment & Plan:   Christy [name] was seen today for nevus.  Diagnoses and all orders for this visit:  Multiple atypical skin moles -     Ambulatory referral to Dermatology   I have discontinued Christy [name]'s methylPREDNISolone, dextromethorphan-guaiFENesin,  HYDROcodone-homatropine, azithromycin, and albuterol. I am also having her maintain her Melatonin, cetirizine, NATAZIA, fluticasone, and SYNTHROID.  No orders of the defined types were placed in this encounter.   Problem List Items Addressed This Visit    None    Visit Diagnoses    Multiple atypical skin moles    -  Primary   Relevant Orders   Ambulatory referral to Dermatology       Follow-up: No follow-ups on file.  Wilfred Lacy, NP

## 2018-06-13 NOTE — Patient Instructions (Signed)
You will be contacted to schedule appt with dermatology.

## 2018-06-14 ENCOUNTER — Encounter: Payer: Self-pay | Admitting: Family Medicine

## 2018-06-14 ENCOUNTER — Ambulatory Visit (INDEPENDENT_AMBULATORY_CARE_PROVIDER_SITE_OTHER): Payer: No Typology Code available for payment source | Admitting: Family Medicine

## 2018-06-14 ENCOUNTER — Ambulatory Visit (INDEPENDENT_AMBULATORY_CARE_PROVIDER_SITE_OTHER): Payer: No Typology Code available for payment source

## 2018-06-14 VITALS — BP 118/82 | HR 83 | Temp 98.4°F | Ht 65.0 in | Wt 140.4 lb

## 2018-06-14 DIAGNOSIS — M25551 Pain in right hip: Secondary | ICD-10-CM

## 2018-06-14 MED ORDER — IBUPROFEN-FAMOTIDINE 800-26.6 MG PO TABS: 1.0000 | ORAL_TABLET | Freq: Three times a day (TID) | ORAL | 3 refills | Status: DC

## 2018-06-14 MED ORDER — DICLOFENAC SODIUM 2 % TD SOLN: 1.0000 "application " | Freq: Two times a day (BID) | TRANSDERMAL | 3 refills | Status: DC

## 2018-06-14 NOTE — Patient Instructions (Signed)
Nice to meet you  Please try the exercises  Please try ice on the area  Please try the medications if necessary  I will call you back with the results from today  Please see me back in 2-3 weeks if no better  Have fun in [place].

## 2018-06-14 NOTE — Assessment & Plan Note (Signed)
Pain likely related to greater troch bursitis. Less likely for intra-articular problem. Possible for pincer/CAM lesion.  - GT injection  - counseled on HEP and supportive  - duexis and pennsaid  - xray  - if no improvement consider PT

## 2018-06-14 NOTE — Progress Notes (Signed)
Christy [name] - 48 y.o. female MRN [id number]  Date of birth: 01-19-1971  SUBJECTIVE:  Including CC & ROS.  Chief Complaint  Patient presents with  . Pain    Right hip pain/ sharp intense pain at times with movement/ 15 years    [name] is a 48 y.o. female that is presenting with acute on chronic right lateral hip pain.  She denies any specific inciting event.  Pain is intermittent.  Pain can be sharp and stabbing.  Is usually localized to the lateral hip but can radiate down to the knee laterally.  Is fairly active and notices it when she lies on the affected side.  It is worse when she is getting up from sitting for prolonged period of time.  Has not tried any formal modalities.  Had some improvement with a muscle relaxer.  No prior history of surgery.  She works in Engineer, technical sales.    Review of Systems  Constitutional: Negative for fever.  HENT: Negative for congestion.   Respiratory: Negative for cough.   Cardiovascular: Negative for chest pain.  Gastrointestinal: Negative for abdominal pain.  Musculoskeletal: Positive for gait problem.  Skin: Negative for color change.  Neurological: Negative for weakness.  Hematological: Negative for adenopathy.  Psychiatric/Behavioral: Negative for agitation.    HISTORY: Past Medical, Surgical, Social, and Family History Reviewed & Updated per EMR.   Pertinent Historical Findings include:  Past Medical History:  Diagnosis Date  . Thyroid cancer ([place]) 2004   s/p thyroidectomy     Past Surgical History:  Procedure Laterality Date  . THYROIDECTOMY  2004  . TONSILLECTOMY      Allergies  Allergen Reactions  . Neosporin [Neomycin-Bacitracin Zn-Polymyx]   . Thyrogen [Thyrotropin] Other (See Comments)    Thyrogen allergy >> needs pretreatment with steroids    Family History  Problem Relation Age of Onset  . Arthritis Mother   . Colon polyps Mother   . Heart disease Maternal Grandfather   . Heart disease Paternal Grandfather   .  Heart disease Father   . Colon polyps Father   . Dementia Maternal Grandmother      Social History   Socioeconomic History  . Marital status: Married    Spouse name: Not on file  . Number of children: Not on file  . Years of education: Not on file  . Highest education level: Not on file  Occupational History  . Not on file  Social Needs  . Financial resource strain: Not on file  . Food insecurity:    Worry: Not on file    Inability: Not on file  . Transportation needs:    Medical: Not on file    Non-medical: Not on file  Tobacco Use  . Smoking status: Never Smoker  . Smokeless tobacco: Never Used  Substance and Sexual Activity  . Alcohol use: Yes    Comment: social  . Drug use: Never  . Sexual activity: Yes    Birth control/protection: Pill  Lifestyle  . Physical activity:    Days per week: Not on file    Minutes per session: Not on file  . Stress: Not on file  Relationships  . Social connections:    Talks on phone: Not on file    Gets together: Not on file    Attends religious service: Not on file    Active member of club or organization: Not on file    Attends meetings of clubs or organizations: Not on file  Relationship status: Not on file  . Intimate partner violence:    Fear of current or ex partner: Not on file    Emotionally abused: Not on file    Physically abused: Not on file    Forced sexual activity: Not on file  Other Topics Concern  . Not on file  Social History Narrative  . Not on file     PHYSICAL EXAM:  VS: BP 118/82   Pulse 83   Temp 98.4 F (36.9 C) (Oral)   Ht 5\' 5"  (1.651 m)   Wt 140 lb 6.4 oz (63.7 kg)   LMP 06/08/2018   SpO2 98%   BMI 23.36 kg/m  Physical Exam Gen: NAD, alert, cooperative with exam, well-appearing ENT: normal lips, normal nasal mucosa,  Eye: normal EOM, normal conjunctiva and lids CV:  no edema, +2 pedal pulses   Resp: no accessory muscle use, non-labored,  Skin: no rashes, no areas of induration    Neuro: normal tone, normal sensation to touch Psych:  normal insight, alert and oriented MSK:  Right hip:  TTP over the GT  No TTP over the SI joint or piriformis  Normal IR and ER  Normal strength to resistance with hip flexion  Normal FADIR and FABER  Negative SLR b/l  Weakness with hip abduction  Neurovascularly intact    Aspiration/Injection Procedure Note LAQUETA BONAVENTURA 1971-02-11  Procedure: Injection Indications: right hip pain   Procedure Details Consent: Risks of procedure as well as the alternatives and risks of each were explained to the (patient/caregiver).  Consent for procedure obtained. Time Out: Verified patient identification, verified procedure, site/side was marked, verified correct patient position, special equipment/implants available, medications/allergies/relevent history reviewed, required imaging and test results available.  Performed.  The area was cleaned with iodine and alcohol swabs.    The right greater trochanter bursa was injected using 1 cc's of 40 mg Depo-Medrol and 4 cc's of 0.25% bupivacaine with a 22 3 1/2" needle.  Ultrasound was used. Images were obtained in trans views showing the injection.     A sterile dressing was applied.  Patient did tolerate procedure well.        ASSESSMENT & PLAN:   Right hip pain Pain likely related to greater troch bursitis. Less likely for intra-articular problem. Possible for pincer/CAM lesion.  - GT injection  - counseled on HEP and supportive  - duexis and pennsaid  - xray  - if no improvement consider PT

## 2018-06-15 ENCOUNTER — Telehealth: Payer: Self-pay | Admitting: Family Medicine

## 2018-06-15 ENCOUNTER — Other Ambulatory Visit: Payer: Self-pay | Admitting: *Deleted

## 2018-06-15 ENCOUNTER — Telehealth: Payer: Self-pay | Admitting: Nurse Practitioner

## 2018-06-15 NOTE — Telephone Encounter (Signed)
Pt returned call and xray results given to her with verbal understanding. She stated she received an injection yesterday for her hip and it was sore. Wanted to know is she can take Tylenol. Pt has medications prescribed from the visit yesterday. Will try to change the mail order pharmacy to a local one. She is going to be out of town when this comes from the mail order. Will notify the pharmacies.

## 2018-06-15 NOTE — Telephone Encounter (Signed)
Called flow at [facility] at [facility] and she explained the process with these medications. Not able change pharmacies. So they will reach out to the patient.

## 2018-06-15 NOTE — Telephone Encounter (Signed)
Left VM for patient. If she calls back please have her speak with a nurse/CMA and inform that her xrays look normal. The [facility] can report results to patient.   If any questions then please take the best time and phone number to call and I will try to call her back.   Rosemarie Ax, MD [facility] 06/15/2018, 8:18 AM

## 2018-07-22 IMAGING — MG DIGITAL SCREENING BILATERAL MAMMOGRAM WITH TOMO AND CAD
8 series · 9 of 24 positions shown · non-contrast
Comparison: Previous exam(s).

CLINICAL DATA: Screening.

EXAM:
DIGITAL SCREENING BILATERAL MAMMOGRAM WITH TOMO AND CAD

[R MLO synth-2D]
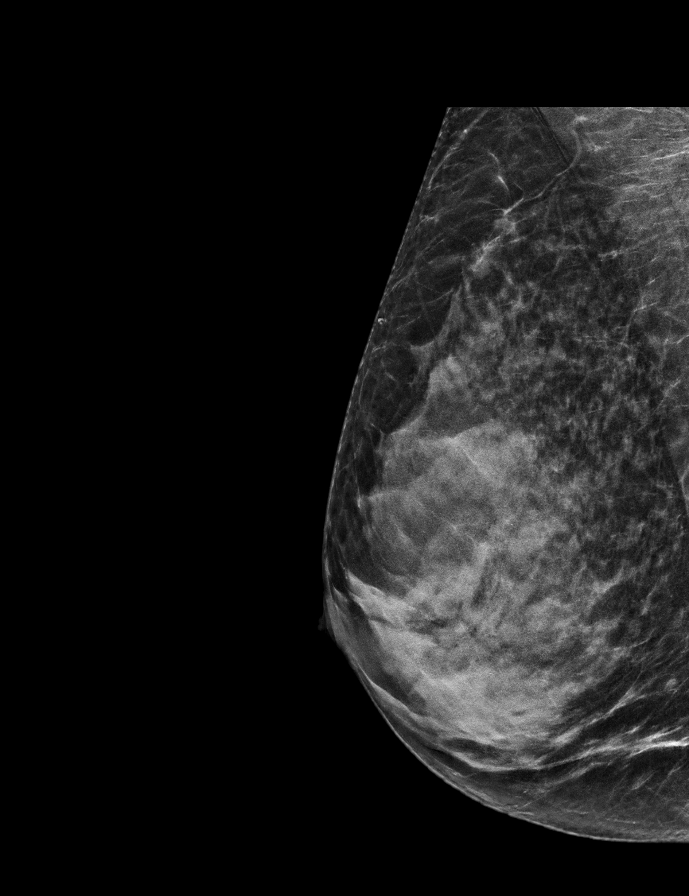

[L CC synth-2D]
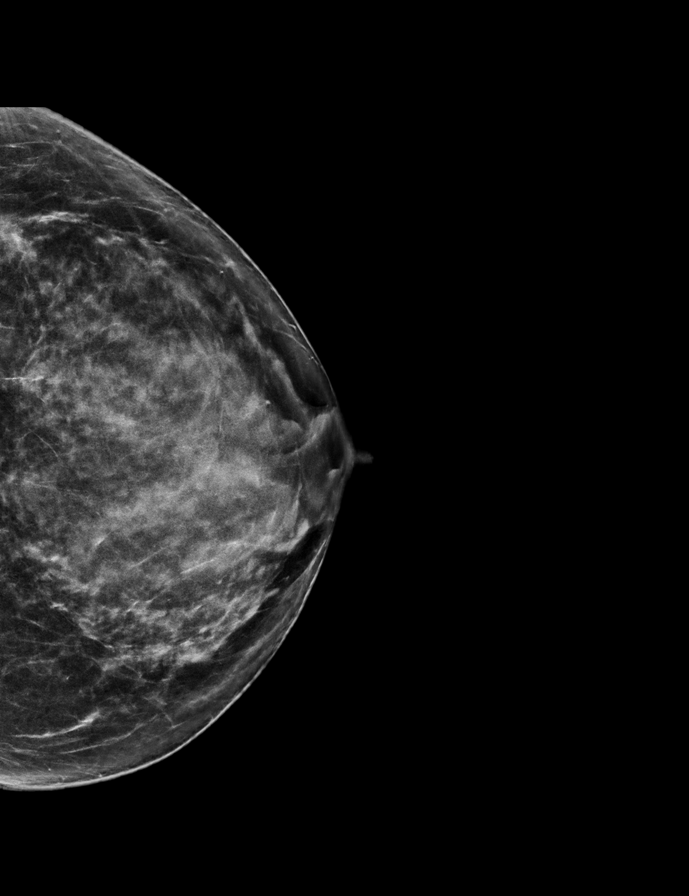

[L MLO synth-2D]
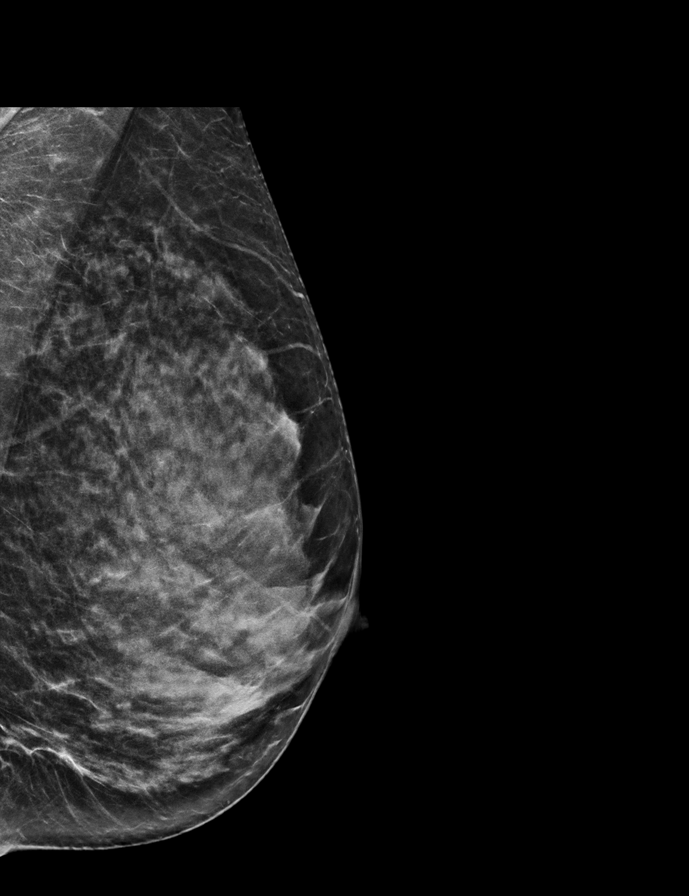

[R CC synth-2D]
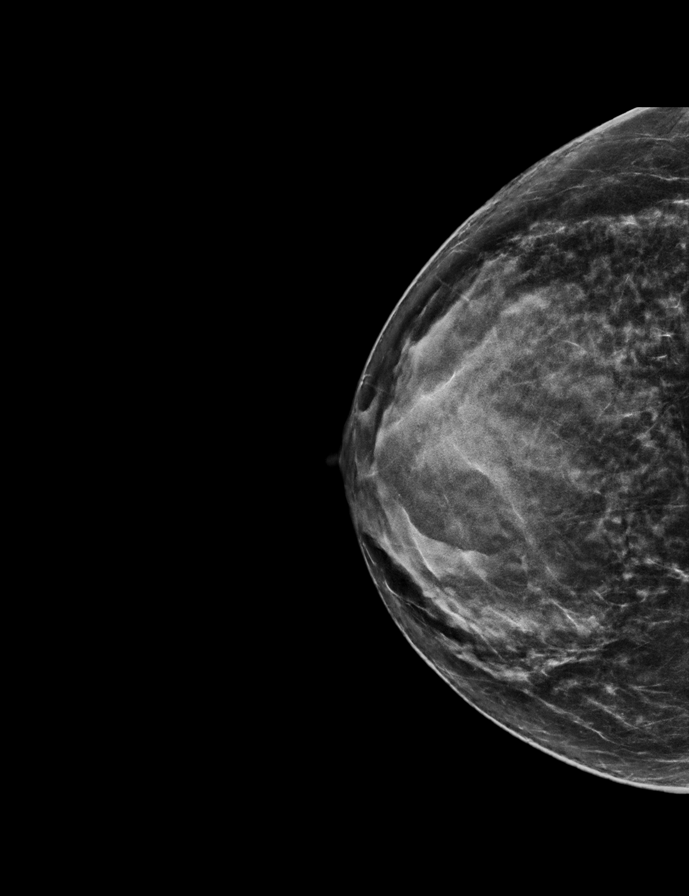

[L MLO tomo · 2 of 58 frames shown]
[frame 19/58]
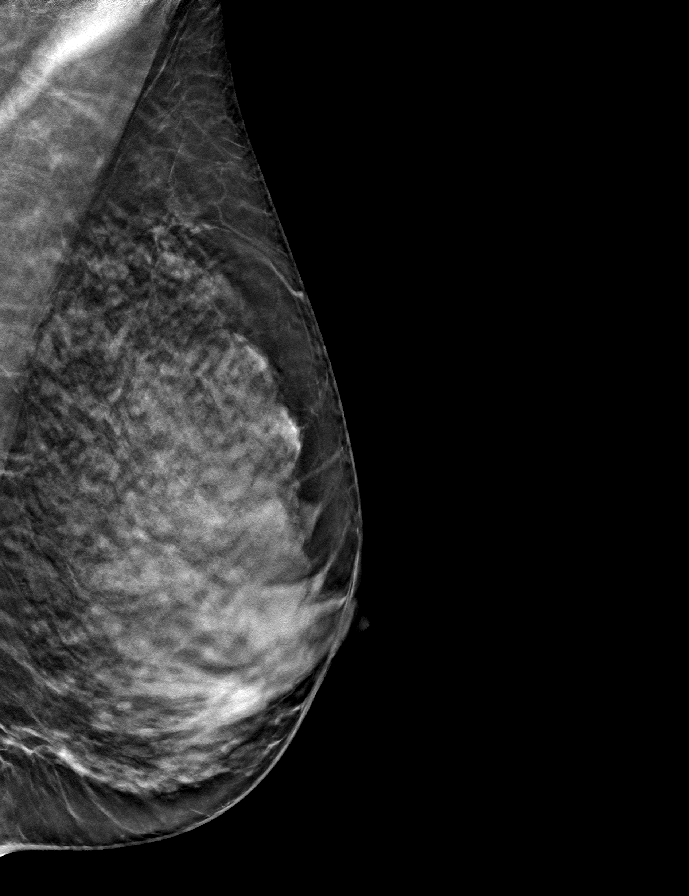
[frame 29/58]
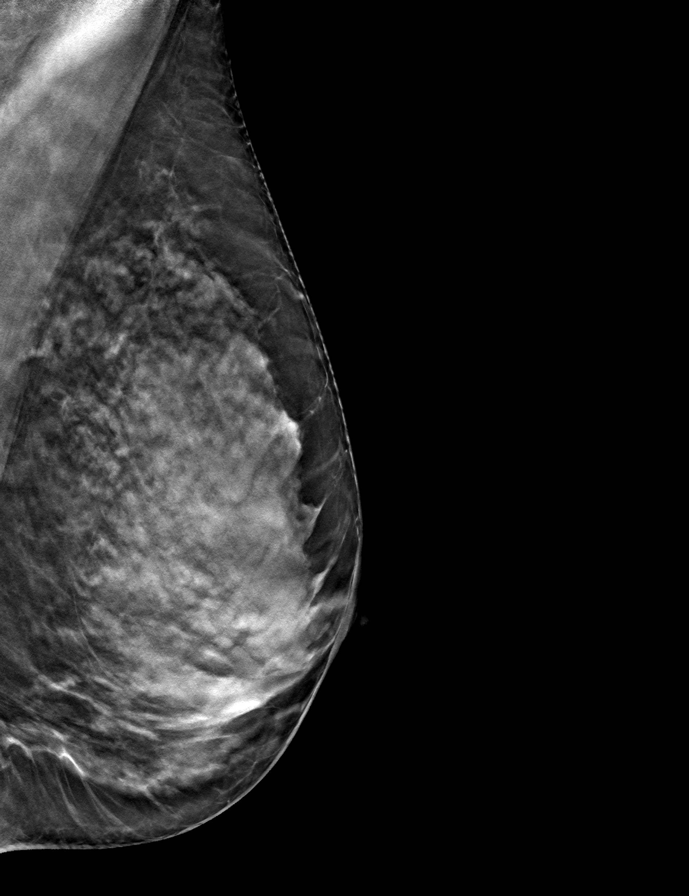

[L CC tomo · tomo slice 32/63.0]
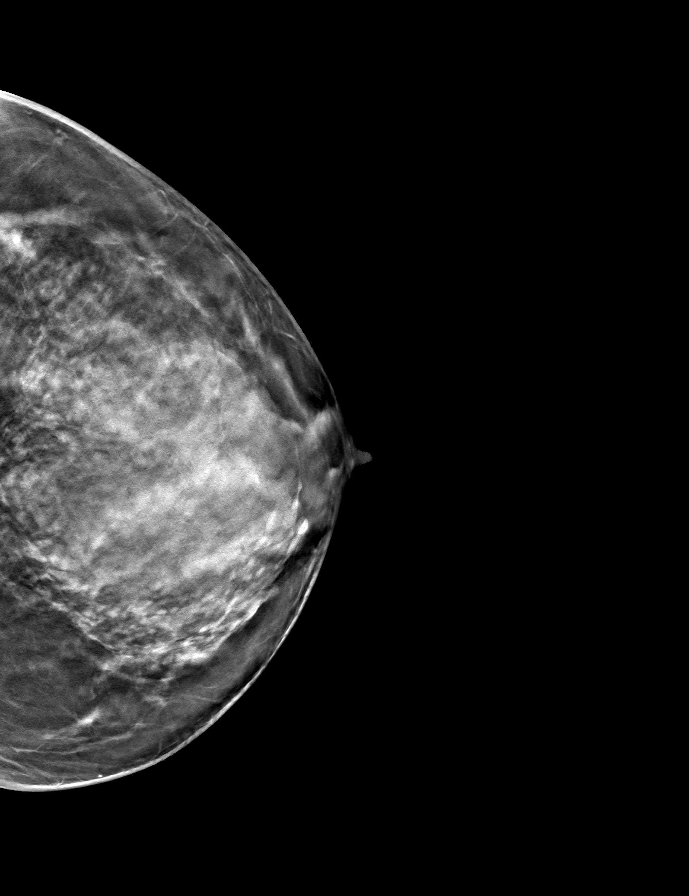

[R MLO tomo · tomo slice 28/55.0]
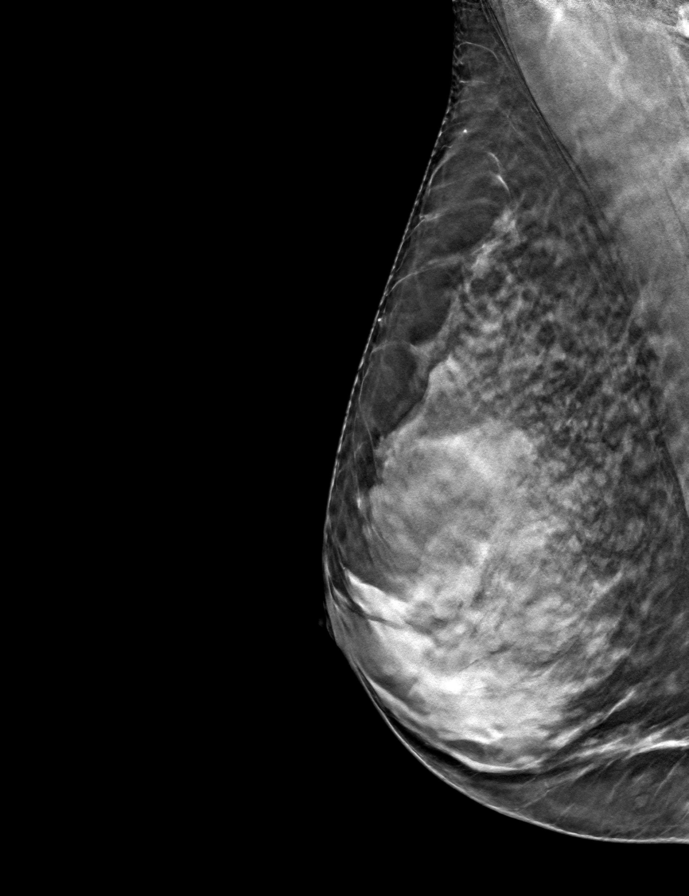

[R CC tomo · tomo slice 31/60.0]
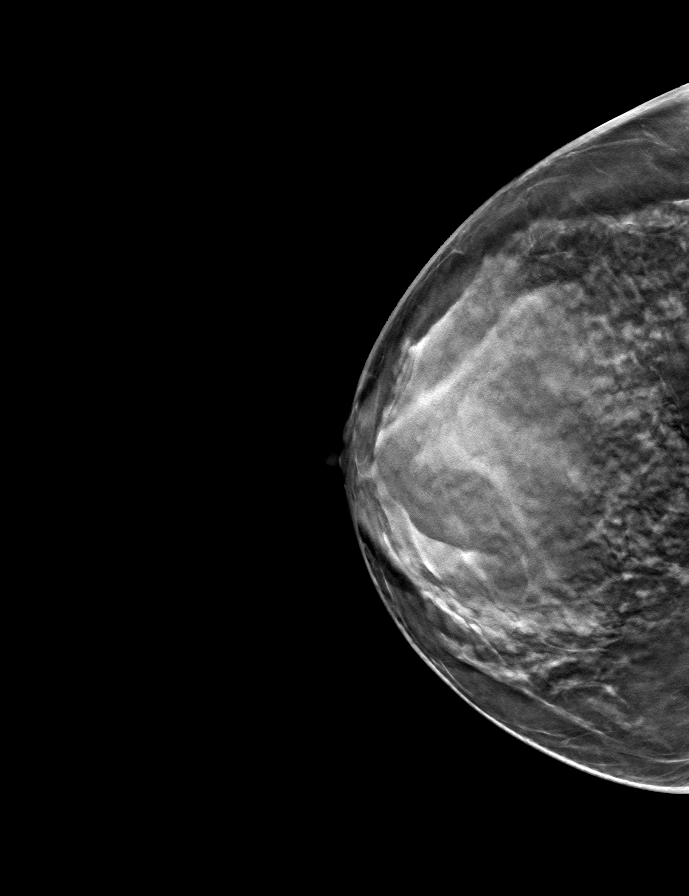

[9 of 24 positions shown; findings below may reference images not displayed]

ACR Breast Density Category d: The breast tissue is extremely dense,
which lowers the sensitivity of mammography.
FINDINGS: There are no findings suspicious for malignancy. Images were
processed with CAD.
IMPRESSION: No mammographic evidence of malignancy. A result letter of this
screening mammogram will be mailed directly to the patient.

RECOMMENDATION:
Screening mammogram in one year. (Code:RA-I-AVB)

BI-RADS CATEGORY  1: Negative.

## 2018-07-26 MED FILL — NATAZIA 28 TABLET: 3/2-2/2-3/1 | 84 days supply | Qty: 84 | Fill #1

## 2018-08-16 ENCOUNTER — Encounter: Payer: Self-pay | Admitting: *Deleted

## 2018-10-11 ENCOUNTER — Other Ambulatory Visit: Payer: Self-pay | Admitting: Nurse Practitioner

## 2018-10-11 DIAGNOSIS — J301 Allergic rhinitis due to pollen: Secondary | ICD-10-CM

## 2018-10-11 MED FILL — FLUTICASONE PROP 50 MCG SPR: 50 | 30 days supply | Qty: 16 | Fill #0

## 2018-10-11 MED FILL — NATAZIA 28 TABLET: 3/2-2/2-3/1 | 84 days supply | Qty: 84 | Fill #2

## 2018-10-11 MED FILL — SYNTHROID 112 MCG TABLET: 112 | 90 days supply | Qty: 90 | Fill #3

## 2018-10-11 NOTE — Telephone Encounter (Signed)
Please advise, ok to send in?

## 2018-11-08 MED FILL — FLUTICASONE PROP 50 MCG SPR: 50 | 90 days supply | Qty: 48 | Fill #1

## 2018-12-06 ENCOUNTER — Other Ambulatory Visit: Payer: Self-pay

## 2018-12-09 ENCOUNTER — Encounter: Payer: Self-pay | Admitting: Internal Medicine

## 2018-12-09 ENCOUNTER — Ambulatory Visit (INDEPENDENT_AMBULATORY_CARE_PROVIDER_SITE_OTHER): Payer: No Typology Code available for payment source | Admitting: Internal Medicine

## 2018-12-09 ENCOUNTER — Other Ambulatory Visit: Payer: Self-pay

## 2018-12-09 VITALS — BP 120/80 | HR 68 | Ht 65.0 in | Wt 140.0 lb

## 2018-12-09 DIAGNOSIS — E032 Hypothyroidism due to medicaments and other exogenous substances: Secondary | ICD-10-CM

## 2018-12-09 DIAGNOSIS — C73 Malignant neoplasm of thyroid gland: Secondary | ICD-10-CM

## 2018-12-09 LAB — T4, FREE: Free T4: 1.31 ng/dL (ref 0.60–1.60)

## 2018-12-09 LAB — TSH: TSH: 1.75 u[IU]/mL (ref 0.35–4.50)

## 2018-12-09 MED ORDER — SYNTHROID 112 MCG PO TABS: 112.0000 ug | ORAL_TABLET | Freq: Every day | ORAL | 3 refills | Status: DC

## 2018-12-09 NOTE — Patient Instructions (Signed)
Please stop at the lab.  Please continue the current Synthroid dose (112 mcg) daily.  Take the thyroid hormone every day, with water, at least 30 minutes before breakfast, separated by at least 4 hours from: - acid reflux medications - calcium - iron - multivitamins  Please return in 1 year.  

## 2018-12-09 NOTE — Progress Notes (Signed)
Patient ID: Christy [name], female   DOB: 1971-04-12, 48 y.o.   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 She was seen by Dr Chalmers Cater in the past.  Last visit with me 1 year ago.  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 Almetta Lovely note, stimulated Tg was 2.9 08/10/2008 Thyrogen stimulated WBS negative 10/15/2009 ultrasound neck with 3 abnormal cervical lymph nodes 11/08/2009 FNA of one of the lymph node that was found to be without fatty hilum: negative for metastases - she had a traumatic experience then and would not want to repeat this 09/26/2010 Thyrogen stimulated WBS negative except uptake in colon 10/07/2012 Thyrogen stimulated WBS negative  ! Patient has a Thyrogen allergy >> needs pretreatment with steroids 10/02/2013: Tg 0.2, ATA <1 10/16/2013: Neck U/S:  Small area of irregular tissue in the left thyroid bed measures roughly 1 cm in length and 0.5 cm in width. This is very ill-defined appearing and may represent scar tissue. Thyroid carcinoma recurrence cannot be completely excluded. Prior whole body nuclear medicine study was negative for any thyroid activity in the neck. No abnormal tissue is identified in the right thyroid bed. No abnormal lymph nodes are identified.  Per Dr. Almetta Lovely note, patient had many WBS's and a PET scan in the past as she had positive stimulated thyroglobulin, without an identified Thyroid tissue  focus. 09/05/2014: CT neck and chest: 1. Negative neck CT status post thyroidectomy. No soft tissue mass or lymphadenopathy corresponding to the right supraclavicular palpable area of concern, or elsewhere in the neck. 2. Negative chest CT.  No acute or metastatic process identified. 12/12/2014: TSH 1.45, 12/25/2014: ATA <1, Tg(RIA) <2.0  I reviewed patient's TFTs per records from Dr Chalmers Cater.: 07/14/2006: Tg 1, ATA 3.6 03/2007: Tg <0.5, Stimulated Tg 2.7 12/2007: Tg <0.5, TSH 0.1 2013: Tg <0.5, ATA <20 09/16/2012: TSH 0.644, Tg 0.1, ATA <1 10/07/2012: Tg 2.0, ATA <1 06/28/2013: TSH 1.1   More recent labs: Lab Results  Component Value Date   TSH 0.67 12/10/2017   TSH 1.84 08/30/2017   TSH 1.43 04/23/2017   TSH 0.21 (L) 02/19/2017   TSH 0.22 (L) 12/11/2016   TSH 1.31 12/12/2015   TSH 1.45 12/12/2014   TSH 0.96 10/02/2013   FREET4 1.34 12/10/2017   FREET4 1.28 04/23/2017   FREET4 1.53 02/19/2017   FREET4 1.42 12/11/2016   FREET4 1.30 12/12/2015   FREET4 1.20 12/12/2014   FREET4 1.52 10/02/2013   Lab Results  Component Value Date   THYROGLB 0.1 (L) 12/10/2017   THYROGLB <0.1 (L) 12/11/2016   THYROGLB 0.2 (L) 12/12/2015   THYROGLB <2.0 12/25/2014   THGAB <1 12/10/2017   THGAB <1.0 12/11/2016   THGAB <1.0 12/12/2015   THGAB <1.0 12/25/2014   Pt is on Synthroid d.a.w. 112 Mcg daily, taken: - in am - fasting - at least 30 min from b'fast - no Ca, Fe, PPIs - on MVI at night - not on Biotin  Pt denies: - feeling nodules in neck - hoarseness - dysphagia - choking - SOB with  lying down  She had a ow vit D per ObGyn >> started vitamin D. Also started Fish oil and MVI at night.  ROS: Constitutional: no weight gain/no weight loss, no fatigue, no subjective hyperthermia, no subjective hypothermia Eyes: no blurry vision, no xerophthalmia ENT: no sore throat, + see HPI Cardiovascular: no CP/no SOB/no palpitations/no leg swelling Respiratory: no cough/no SOB/no  wheezing Gastrointestinal: no N/no V/no D/no C/no acid reflux Musculoskeletal: no muscle aches/no joint aches Skin: no rashes, no hair loss Neurological: no tremors/no numbness/no tingling/no dizziness  I reviewed pt's medications, allergies, PMH, social hx, family hx, and changes were documented in the history of present illness. Otherwise, unchanged from my initial visit note.  Past Medical History:  Diagnosis Date  . Thyroid cancer ([place]) 2004   s/p thyroidectomy    History   Social History  . Marital Status: Married    Spouse Name: N/A    Number of Children: 2   Occupational History  . Systems analyst - [employer]   Social History Main Topics  . Smoking status: Never Smoker   . Smokeless tobacco: No  . Alcohol Use: Occasional: wine, beer  . Drug Use: No  Exercise: walk/run - daily   Current Outpatient Medications on File Prior to Visit  Medication Sig Dispense Refill  . cetirizine (ZYRTEC) 10 MG tablet Take 1 tablet (10 mg total) by mouth daily. 30 tablet 11  . Diclofenac Sodium (PENNSAID) 2 % SOLN Place 1 application onto the skin 2 (two) times daily. 1 Bottle 3  . fluticasone (FLONASE) 50 MCG/ACT nasal spray INSTILL 2 SPRAYS INTO BOTH NOSTRILS DAILY 16 g 11  . Ibuprofen-Famotidine 800-26.6 MG TABS Take 1 tablet by mouth 3 (three) times daily. 90 tablet 3  . Melatonin 3 MG TABS Take by mouth.    Marland Kitchen NATAZIA 3/2-2/2-3/1 MG tablet   2  . SYNTHROID 112 MCG tablet Take 1 tablet (112 mcg total) by mouth daily before breakfast. 90 tablet 3   No current facility-administered medications on file prior to visit.    Allergies  Allergen Reactions  . Neosporin [Neomycin-Bacitracin Zn-Polymyx]   . Thyrogen [Thyrotropin] Other (See Comments)    Thyrogen allergy >> needs pretreatment with steroids   FH: H/o heart ds. in father  PE: BP 120/80   Pulse 68   Ht 5\' 5"  (1.651 m)   Wt 140 lb (63.5 kg)   SpO2 98%   BMI 23.30 kg/m  Wt Readings from Last 3 Encounters:  12/09/18  140 lb (63.5 kg)  06/14/18 140 lb 6.4 oz (63.7 kg)  06/13/18 138 lb 6.4 oz (62.8 kg)   Constitutional: normal weight, in NAD Eyes: PERRLA, EOMI, no exophthalmos ENT: moist mucous membranes, no neck masses palpable, no cervical lymphadenopathy Cardiovascular: RRR, No MRG Respiratory: CTA B Gastrointestinal: abdomen soft, NT, ND, BS+ Musculoskeletal: no deformities, strength intact in all 4 Skin: moist, warm, no rashes Neurological: no tremor with outstretched hands, DTR normal in all 4  ASSESSMENT: 1. Follicular thyroid cancer - Thyroid U/S (10/16/2013): Small area of irregular tissue in the left thyroid bed measures roughly 1 cm in length and 0.5 cm in width. This is very ill-defined appearing and may represent scar tissue. Thyroid carcinoma recurrence cannot be completely excluded. Prior whole body nuclear medicine study was negative for any thyroid activity in the neck. No abnormal tissue is identified in the right thyroid bed. No abnormal lymph nodes are identified. - CT neck and chest (09/05/2014):  1. Negative neck CT status  post thyroidectomy. No soft tissue mass or lymphadenopathy corresponding to the right supraclavicular palpable area of concern, or elsewhere in the neck. 2. Negative chest CT.  No acute or metastatic process identified.  2. Postsurgical Hypothyroidism  PLAN:  1. Follicular ThyCa - In remission -She has a history of detectable thyroglobulin levels, but very low, alternating with undetectable levels.  I believe that this may be due to the thyroglobulin assay.  Her antithyroid antibodies are negative, but they have been positive in 2008. -At last visit, her thyroglobulin was 0.1, improved. ATA negative. -Reviewed together the latest neck ultrasound report from 2015: Scar tissue versus thyroid remnant in thyroid bed, but this has been present since her surgery per records from Dr. Chalmers Cater.  This was the reason for many whole-body scans and PET scans that she  had in the past.  She had a chest and neck CT scan in 2016 (with contrast) which was negative for metastases or recurrences. -She has no neck compression symptoms -Today we will check again her Tg + ATA and will need to check another neck ultrasound if thyroglobulin starts to increase  2. Postsurgical hypothyroidism, on Synthroid d.a.w. therapy. - latest thyroid labs reviewed with pt >> normal at last visit - she continues on  Synthroid 112 mcg daily - pt feels good on this dose. - we discussed about taking the thyroid hormone every day, with water, >30 minutes before breakfast, separated by >4 hours from acid reflux medications, calcium, iron, multivitamins. Pt. is taking it correctly. - will check thyroid tests today: TSH and fT4 - If labs are abnormal, she will need to return for repeat TFTs in 1.5 months  Needs refills.  Office Visit on 12/09/2018  Component Date Value Ref Range Status  . TSH 12/09/2018 1.75  0.35 - 4.50 uIU/mL Final  . Free T4 12/09/2018 1.31  0.60 - 1.60 ng/dL Final   Comment: Specimens from patients who are undergoing biotin therapy and /or ingesting biotin supplements may contain high levels of biotin.  The higher biotin concentration in these specimens interferes with this Free T4 assay.  Specimens that contain high levels  of biotin may cause false high results for this Free T4 assay.  Please interpret results in light of the total clinical presentation of the patient.     Thyroid tests are normal.  Thyroglobulin and ATA antibodies pending.  Philemon Kingdom, MD PhD [facility]

## 2018-12-12 LAB — THYROGLOBULIN LEVEL: Thyroglobulin: 0.1 ng/mL — ABNORMAL LOW

## 2018-12-12 LAB — THYROGLOBULIN ANTIBODY: Thyroglobulin Ab: <1 IU/mL (ref <=1)

## 2018-12-27 MED FILL — SYNTHROID 112 MCG TABLET: 112 | 90 days supply | Qty: 90 | Fill #0

## 2018-12-27 MED FILL — NATAZIA 28 TABLET: 3/2-2/2-3/1 | 84 days supply | Qty: 84 | Fill #3

## 2019-01-06 ENCOUNTER — Encounter: Payer: Self-pay | Admitting: Nurse Practitioner

## 2019-01-06 DIAGNOSIS — E032 Hypothyroidism due to medicaments and other exogenous substances: Secondary | ICD-10-CM

## 2019-01-06 DIAGNOSIS — C73 Malignant neoplasm of thyroid gland: Secondary | ICD-10-CM

## 2019-01-09 ENCOUNTER — Encounter: Payer: Self-pay | Admitting: Internal Medicine

## 2019-01-13 NOTE — Telephone Encounter (Signed)
Spoke with the patient she has the form for [employer] that they told her to fill out and send back. I advised her to do this and attach a note from [employer]

## 2019-01-13 NOTE — Telephone Encounter (Signed)
LMTCB regarding this info

## 2019-01-31 MED FILL — FLUTICASONE PROP 50 MCG SPR: 50 | 90 days supply | Qty: 48 | Fill #2

## 2019-03-22 MED FILL — SYNTHROID 112 MCG TABLET: 112 | 90 days supply | Qty: 90 | Fill #1

## 2019-04-04 MED FILL — NATAZIA 28 TABLET: 3/2-2/2-3/1 | 84 days supply | Qty: 84 | Fill #0

## 2019-06-16 MED FILL — NATAZIA 28 TABLET: 3/2-2/2-3/1 | 84 days supply | Qty: 84 | Fill #0

## 2019-06-16 MED FILL — SYNTHROID 112 MCG TABLET: 112 | 90 days supply | Qty: 90 | Fill #2

## 2019-06-16 MED FILL — FLUTICASONE PROP 50 MCG SPR: 50 | 90 days supply | Qty: 48 | Fill #3

## 2019-06-21 ENCOUNTER — Other Ambulatory Visit: Payer: Self-pay | Admitting: Obstetrics and Gynecology

## 2019-06-21 DIAGNOSIS — Z1231 Encounter for screening mammogram for malignant neoplasm of breast: Secondary | ICD-10-CM

## 2019-07-28 ENCOUNTER — Ambulatory Visit
Admission: RE | Admit: 2019-07-28 | Discharge: 2019-07-28 | Disposition: A | Discharge status: Home / Self Care | Payer: No Typology Code available for payment source | Source: Ambulatory Visit | Attending: Obstetrics and Gynecology | Admitting: Obstetrics and Gynecology

## 2019-07-28 ENCOUNTER — Other Ambulatory Visit: Payer: Self-pay

## 2019-07-28 DIAGNOSIS — Z1231 Encounter for screening mammogram for malignant neoplasm of breast: Secondary | ICD-10-CM

## 2019-09-04 MED FILL — NATAZIA 28 TABLET: 3/2-2/2-3/1 | 84 days supply | Qty: 84 | Fill #1

## 2019-09-20 MED FILL — SYNTHROID 112 MCG TABLET: 112 | 90 days supply | Qty: 90 | Fill #3

## 2019-11-22 MED FILL — NATAZIA 28 TABLET: 3/2-2/2-3/1 | 84 days supply | Qty: 84 | Fill #2

## 2019-12-19 ENCOUNTER — Ambulatory Visit: Payer: No Typology Code available for payment source | Admitting: Internal Medicine

## 2020-01-02 ENCOUNTER — Ambulatory Visit: Payer: No Typology Code available for payment source | Admitting: Internal Medicine

## 2020-01-02 ENCOUNTER — Other Ambulatory Visit: Payer: Self-pay

## 2020-01-02 ENCOUNTER — Encounter: Payer: Self-pay | Admitting: Internal Medicine

## 2020-01-02 VITALS — BP 110/80 | HR 88 | Ht 65.0 in | Wt 141.0 lb

## 2020-01-02 DIAGNOSIS — C73 Malignant neoplasm of thyroid gland: Secondary | ICD-10-CM

## 2020-01-02 DIAGNOSIS — E032 Hypothyroidism due to medicaments and other exogenous substances: Secondary | ICD-10-CM

## 2020-01-02 LAB — TSH: TSH: 3.26 u[IU]/mL (ref 0.35–4.50)

## 2020-01-02 LAB — T4, FREE: Free T4: 1.19 ng/dL (ref 0.60–1.60)

## 2020-01-02 MED ORDER — SYNTHROID 112 MCG PO TABS: 112.0000 ug | ORAL_TABLET | Freq: Every day | ORAL | 0 refills | Status: DC

## 2020-01-02 MED ORDER — SYNTHROID 112 MCG PO TABS: 112.0000 ug | ORAL_TABLET | Freq: Every day | ORAL | 3 refills | Status: DC

## 2020-01-02 MED FILL — SYNTHROID 112 MCG TABLET: 112 | 90 days supply | Qty: 90 | Fill #0

## 2020-01-02 NOTE — Progress Notes (Signed)
Patient ID: RAVINDER HOFLAND, female   DOB: 29-Nov-1970, 49 y.o.   MRN: [id number]  This visit occurred during the SARS-CoV-2 public health emergency.  Safety protocols were in place, including screening questions prior to the visit, additional usage of staff PPE, and extensive cleaning of exam room while observing appropriate contact time as indicated for disinfecting solutions.   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 She was seen by Dr Chalmers Cater in the past.  Last visit with me 1 year ago.   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 Almetta Lovely note, stimulated Tg was 2.9 08/10/2008 Thyrogen stimulated WBS negative 10/15/2009 ultrasound neck with 3 abnormal cervical lymph nodes 11/08/2009 FNA of one of the lymph node that was found to be without fatty hilum: negative for metastases - she had a traumatic experience then and would not want to repeat this 09/26/2010 Thyrogen stimulated WBS negative except uptake in colon 10/07/2012 Thyrogen stimulated WBS negative  ! Patient has a Thyrogen allergy >> needs pretreatment with steroids 10/02/2013: Tg 0.2, ATA <1 10/16/2013: Neck U/S:  Small area of irregular tissue in the left thyroid bed measures roughly 1 cm in length and 0.5 cm in width. This is very ill-defined appearing and may represent scar tissue. Thyroid carcinoma recurrence cannot be completely excluded. Prior whole body nuclear medicine study was negative for  any thyroid activity in the neck. No abnormal tissue is identified in the right thyroid bed. No abnormal lymph nodes are identified. 09/05/2014: CT neck and chest: 1. Negative neck CT status post thyroidectomy. No soft tissue mass or lymphadenopathy corresponding to the right supraclavicular palpable area of concern, or elsewhere in the neck. 2. Negative chest CT.  No acute or metastatic process identified.  Also: 07/14/2006: Tg 1, ATA 3.6 03/2007: Tg <0.5, Stimulated Tg 2.7 12/2007: Tg <0.5, TSH 0.1 2013: Tg <0.5, ATA <20 09/16/2012: TSH 0.644, Tg 0.1, ATA <1 10/07/2012: Tg 2.0, ATA <1 06/28/2013: TSH 1.1  12/12/2014: TSH 1.45, 12/25/2014: ATA <1, Tg(RIA) <2.0  Most recent labs: Lab Results  Component Value Date   TSH 1.75 12/09/2018   TSH 0.67 12/10/2017   TSH 1.84 08/30/2017   TSH 1.43 04/23/2017   TSH 0.21 (L) 02/19/2017   TSH 0.22 (L) 12/11/2016   TSH 1.31 12/12/2015   TSH 1.45 12/12/2014   TSH 0.96 10/02/2013   FREET4 1.31 12/09/2018   FREET4 1.34 12/10/2017   FREET4 1.28 04/23/2017   FREET4 1.53 02/19/2017   FREET4 1.42 12/11/2016   FREET4 1.30 12/12/2015   FREET4 1.20 12/12/2014   FREET4 1.52 10/02/2013   Lab Results  Component Value Date   THYROGLB 0.1 (L) 12/09/2018   THYROGLB 0.1 (L) 12/10/2017   THYROGLB <0.1 (L) 12/11/2016   THYROGLB 0.2 (L) 12/12/2015   THYROGLB <2.0 12/25/2014   THGAB <1 12/09/2018   THGAB <1 12/10/2017   THGAB <1.0 12/11/2016   THGAB <1.0 12/12/2015   THGAB <1.0 12/25/2014   Pt is on Synthroid d.a.w. mcg daily, taken: - in am - fasting - at least 45-60 min from b'fast - no  Ca, Fe, PPIs - stopped MVIs at night - not on Biotin  Pt denies: - feeling nodules in neck - hoarseness - dysphagia - choking - SOB with lying down She has no complaints at today's visit.  She had a low vit D per ObGyn >> started vitamin D - now off.  However, she is now working from home and has time to go for walks  frequently.  ROS: Constitutional: no weight gain/no weight loss, no fatigue, no subjective hyperthermia, no subjective hypothermia Eyes: no blurry vision, no xerophthalmia ENT: no sore throat, + see HPI Cardiovascular: no CP/no SOB/no palpitations/no leg swelling Respiratory: no cough/no SOB/no wheezing Gastrointestinal: no N/no V/no D/no C/no acid reflux Musculoskeletal: no muscle aches/no joint aches Skin: no rashes, no hair loss Neurological: no tremors/no numbness/no tingling/no dizziness  I reviewed pt's medications, allergies, PMH, social hx, family hx, and changes were documented in the history of present illness. Otherwise, unchanged from my initial visit note.  Past Medical History:  Diagnosis Date  . Thyroid cancer ([place]) 2004   s/p thyroidectomy    History   Social History  . Marital Status: Married    Spouse Name: N/A    Number of Children: 2   Occupational History  . Systems analyst - [employer]   Social History Main Topics  . Smoking status: Never Smoker   . Smokeless tobacco: No  . Alcohol Use: Occasional: wine, beer  . Drug Use: No  Exercise: walk/run - daily   Current Outpatient Medications on File Prior to Visit  Medication Sig Dispense Refill  . cetirizine (ZYRTEC) 10 MG tablet Take 1 tablet (10 mg total) by mouth daily. 30 tablet 11  . Diclofenac Sodium (PENNSAID) 2 % SOLN Place 1 application onto the skin 2 (two) times daily. 1 Bottle 3  . fluticasone (FLONASE) 50 MCG/ACT nasal spray INSTILL 2 SPRAYS INTO BOTH NOSTRILS DAILY 16 g 11  . Ibuprofen-Famotidine 800-26.6 MG TABS Take 1 tablet by mouth 3 (three) times daily. (Patient not taking: Reported on 12/09/2018) 90 tablet 3  . Melatonin 3 MG TABS Take by mouth.    Marland Kitchen NATAZIA 3/2-2/2-3/1 MG tablet   2  . SYNTHROID 112 MCG tablet Take 1 tablet (112 mcg total) by mouth daily before breakfast. 90 tablet 3   No current facility-administered medications on file prior to visit.   Allergies  Allergen  Reactions  . Neosporin [Neomycin-Bacitracin Zn-Polymyx]   . Thyrogen [Thyrotropin] Other (See Comments)    Thyrogen allergy >> needs pretreatment with steroids   FH: H/o heart ds. in father  PE: BP 110/80   Pulse 88   Ht 5\' 5"  (1.651 m)   Wt 141 lb (64 kg)   SpO2 99%   BMI 23.46 kg/m  Wt Readings from Last 3 Encounters:  01/02/20 141 lb (64 kg)  12/09/18 140 lb (63.5 kg)  06/14/18 140 lb 6.4 oz (63.7 kg)   Constitutional: normal weight, in NAD Eyes: PERRLA, EOMI, no exophthalmos ENT: moist mucous membranes, no thyromegaly, no cervical lymphadenopathy Cardiovascular: RRR, No MRG Respiratory: CTA B Gastrointestinal: abdomen soft, NT, ND, BS+ Musculoskeletal: no deformities, strength intact in all 4 Skin: moist, warm, no rashes Neurological: no tremor with outstretched hands, DTR normal in all 4   ASSESSMENT: 1. Follicular thyroid cancer - Thyroid U/S (10/16/2013): Small area of irregular tissue in the left thyroid bed measures roughly 1 cm in length and 0.5 cm in width. This is very ill-defined appearing and may represent scar tissue. Thyroid  carcinoma recurrence cannot be completely excluded. Prior whole body nuclear medicine study was negative for any thyroid activity in the neck. No abnormal tissue is identified in the right thyroid bed. No abnormal lymph nodes are identified. - CT neck and chest (09/05/2014):  1. Negative neck CT status post thyroidectomy. No soft tissue mass or lymphadenopathy corresponding to the right supraclavicular palpable area of concern, or elsewhere in the neck. 2. Negative chest CT.  No acute or metastatic process identified.  2. Postsurgical Hypothyroidism  PLAN:  1. Follicular ThyCa -In remission -She has a history of detectable thyroglobulin levels, but very low, alternating with undetectable levels.  This may be due to the thyroglobulin assay.  Her antithyroid antibodies are negative, but they have been positive in 2008. -At last  check, thyroglobulin level was stable, at 0.1, while ATA's were negative -Latest thyroid ultrasound report from 2015 showed: Scar tissue versus thyroid remnant in thyroid bed, but this has been present since her surgery per records from Dr. Chalmers Cater.  This was the reason for many whole-body scans and PET scans that she had in the past.  She had a chest and neck CT scan in 2016 (with contrast) which was negative for metastases or recurrences. -She denies any neck compression symptoms -Today we will check again her thyroglobulin and ATA and will also check another neck ultrasound  2. Postsurgical hypothyroidism, on brand-name Synthroid therapy - latest thyroid labs reviewed with pt >> normal: Lab Results  Component Value Date   TSH 1.75 12/09/2018   - she continues on Synthroid 112 mcg daily - pt feels good on this dose.  She has no complaints today. - we discussed about taking the thyroid hormone every day, with water, >30 minutes before breakfast, separated by >4 hours from acid reflux medications, calcium, iron, multivitamins. Pt. is taking it correctly. - will check thyroid tests today: TSH and fT4 - If labs are abnormal, she will need to return for repeat TFTs in 1.5 months  Component     Latest Ref Rng & Units 01/02/2020  Thyroglobulin     ng/mL 0.2 (L)  TSH     0.35 - 4.50 uIU/mL 3.26  T4,Free(Direct)     0.60 - 1.60 ng/dL 1.19  Thyroglobulin Ab     < or = 1 IU/mL <1   TSH is normal but thyroglobulin slightly higher.  For now, we will continue the current dose of Synthroid, however would like to recheck her TFTs and thyroglobulin in 4 months.  If the TSH is still elevated she may need an increase in dose of Synthroid.  Philemon Kingdom, MD PhD [facility]

## 2020-01-02 NOTE — Patient Instructions (Signed)
Please stop at the lab.  Please continue the current Synthroid dose (112 mcg) daily.  Take the thyroid hormone every day, with water, at least 30 minutes before breakfast, separated by at least 4 hours from: - acid reflux medications - calcium - iron - multivitamins  Please return in 1 year.

## 2020-01-03 LAB — THYROGLOBULIN LEVEL: Thyroglobulin: 0.2 ng/mL — ABNORMAL LOW

## 2020-01-03 LAB — THYROGLOBULIN ANTIBODY: Thyroglobulin Ab: <1 IU/mL (ref <=1)

## 2020-02-06 ENCOUNTER — Other Ambulatory Visit: Payer: No Typology Code available for payment source

## 2020-02-26 MED FILL — NATAZIA 28 TABLET: 3/2-2/2-3/1 | 84 days supply | Qty: 84 | Fill #3

## 2020-02-29 ENCOUNTER — Other Ambulatory Visit: Payer: No Typology Code available for payment source

## 2020-03-06 ENCOUNTER — Ambulatory Visit
Admission: RE | Admit: 2020-03-06 | Discharge: 2020-03-06 | Disposition: A | Discharge status: Home / Self Care | Payer: No Typology Code available for payment source | Source: Ambulatory Visit | Attending: Internal Medicine | Admitting: Internal Medicine

## 2020-03-06 DIAGNOSIS — C73 Malignant neoplasm of thyroid gland: Secondary | ICD-10-CM

## 2020-03-28 MED FILL — SYNTHROID 112 MCG TABLET: 112 | 90 days supply | Qty: 90 | Fill #1

## 2020-05-06 ENCOUNTER — Other Ambulatory Visit (INDEPENDENT_AMBULATORY_CARE_PROVIDER_SITE_OTHER): Payer: No Typology Code available for payment source

## 2020-05-06 ENCOUNTER — Other Ambulatory Visit: Payer: Self-pay

## 2020-05-06 DIAGNOSIS — C73 Malignant neoplasm of thyroid gland: Secondary | ICD-10-CM

## 2020-05-06 DIAGNOSIS — E032 Hypothyroidism due to medicaments and other exogenous substances: Secondary | ICD-10-CM | POA: Diagnosis not present

## 2020-05-06 LAB — TSH: TSH: 7.51 u[IU]/mL — ABNORMAL HIGH (ref 0.35–4.50)

## 2020-05-06 LAB — T4, FREE: Free T4: 1.25 ng/dL (ref 0.60–1.60)

## 2020-05-07 ENCOUNTER — Other Ambulatory Visit: Payer: Self-pay | Admitting: Internal Medicine

## 2020-05-07 DIAGNOSIS — E032 Hypothyroidism due to medicaments and other exogenous substances: Secondary | ICD-10-CM

## 2020-05-07 LAB — THYROGLOBULIN ANTIBODY: Thyroglobulin Ab: <1 IU/mL (ref <=1)

## 2020-05-07 LAB — THYROGLOBULIN LEVEL: Thyroglobulin: 0.2 ng/mL — ABNORMAL LOW

## 2020-05-07 MED ORDER — SYNTHROID 125 MCG PO TABS: 125.0000 ug | ORAL_TABLET | Freq: Every day | ORAL | 3 refills | Status: DC

## 2020-05-07 MED FILL — SYNTHROID 125 MCG TABLET: 125 | 90 days supply | Qty: 90 | Fill #0

## 2020-05-13 ENCOUNTER — Other Ambulatory Visit: Payer: Self-pay | Admitting: Obstetrics and Gynecology

## 2020-05-13 ENCOUNTER — Ambulatory Visit (INDEPENDENT_AMBULATORY_CARE_PROVIDER_SITE_OTHER): Payer: No Typology Code available for payment source | Admitting: Obstetrics and Gynecology

## 2020-05-13 ENCOUNTER — Other Ambulatory Visit: Payer: Self-pay

## 2020-05-13 ENCOUNTER — Encounter: Payer: Self-pay | Admitting: Obstetrics and Gynecology

## 2020-05-13 VITALS — BP 132/88 | HR 79 | Ht 65.0 in | Wt 142.8 lb

## 2020-05-13 DIAGNOSIS — Z01419 Encounter for gynecological examination (general) (routine) without abnormal findings: Secondary | ICD-10-CM

## 2020-05-13 MED ORDER — NATAZIA 3/2-2/2-3/1 MG PO TABS: 1.0000 | ORAL_TABLET | Freq: Every day | ORAL | 4 refills | Status: DC

## 2020-05-13 NOTE — Progress Notes (Signed)
  Subjective:     Christy [name] is a 49 y.o. female P0 qith LMP 04/22/2020 who is here for a comprehensive physical exam. The patient reports no problems. She reports a monthly period lasting 2 days. She is sexually active using COC for contraception. Patient denies any pelvic pain or abnormal discharge. Patient denies any urinary incontinence  Past Medical History:  Diagnosis Date  . Thyroid cancer ([facility]) 2004   s/p thyroidectomy    Past Surgical History:  Procedure Laterality Date  . THYROIDECTOMY  2004  . TONSILLECTOMY     Family History  Problem Relation Age of Onset  . Arthritis Mother   . Colon polyps Mother   . Heart disease Maternal Grandfather   . Heart disease Paternal Grandfather   . Heart disease Father   . Colon polyps Father   . Dementia Maternal Grandmother      Social History   Socioeconomic History  . Marital status: Married    Spouse name: Not on file  . Number of children: Not on file  . Years of education: Not on file  . Highest education level: Not on file  Occupational History  . Not on file  Tobacco Use  . Smoking status: Never Smoker  . Smokeless tobacco: Never Used  Vaping Use  . Vaping Use: Never used  Substance and Sexual Activity  . Alcohol use: Yes    Comment: social  . Drug use: Never  . Sexual activity: Yes    Birth control/protection: Pill  Other Topics Concern  . Not on file  Social History Narrative  . Not on file   Social Determinants of Health   Financial Resource Strain: Not on file  Food Insecurity: Not on file  Transportation Needs: Not on file  Physical Activity: Not on file  Stress: Not on file  Social Connections: Not on file  Intimate Partner Violence: Not on file   Health Maintenance  Topic Date Due  . Hepatitis C Screening  Never done  . COVID-19 Vaccine (1) Never done  . HIV Screening  Never done  . PAP SMEAR-Modifier  03/11/2019  . INFLUENZA VACCINE  12/17/2019  . TETANUS/TDAP  06/17/2022        Review of Systems Pertinent items noted in HPI and remainder of comprehensive ROS otherwise negative.   Objective:  Blood pressure 132/88, pulse 79, height 5\' 5"  (1.651 m), weight 142 lb 12.8 oz (64.8 kg), last menstrual period 04/22/2020.     GENERAL: Well-developed, well-nourished female in no acute distress.  HEENT: Normocephalic, atraumatic. Sclerae anicteric.  NECK: Supple. Normal thyroid.  LUNGS: Clear to auscultation bilaterally.  HEART: Regular rate and rhythm. BREASTS: Symmetric in size. No palpable masses or lymphadenopathy, skin changes, or nipple drainage. ABDOMEN: Soft, nontender, nondistended. No organomegaly. PELVIC: Normal external female genitalia. Vagina is pink and rugated.  Normal discharge. Normal appearing cervix. Uterus is normal in size.  No adnexal mass or tenderness. EXTREMITIES: No cyanosis, clubbing, or edema, 2+ distal pulses.    Assessment:    Healthy female exam.      Plan:    Pap smear normal 05/09/2019- patient declined pap smear this year Patient declined STI testing Patient with normal mammogram 07/2019 Patient with normal health maintenance labs last year Follow up with PCP as scheduled for thyroid See After Visit Summary for Counseling Recommendations

## 2020-05-13 NOTE — Progress Notes (Signed)
NGYN pt presents for annual Negative pap smear on 05/09/2019 at [facility] = 3 (Due to holiday stressors per pt)  STD testing declined

## 2020-05-14 MED FILL — NATAZIA 28 TABLET: 3/2-2/2-3/1 | 84 days supply | Qty: 84 | Fill #0

## 2020-07-29 ENCOUNTER — Other Ambulatory Visit (INDEPENDENT_AMBULATORY_CARE_PROVIDER_SITE_OTHER): Payer: No Typology Code available for payment source

## 2020-07-29 ENCOUNTER — Other Ambulatory Visit: Payer: Self-pay

## 2020-07-29 DIAGNOSIS — E032 Hypothyroidism due to medicaments and other exogenous substances: Secondary | ICD-10-CM | POA: Diagnosis not present

## 2020-07-29 LAB — TSH: TSH: 1.69 u[IU]/mL (ref 0.35–4.50)

## 2020-07-29 LAB — T4, FREE: Free T4: 1.24 ng/dL (ref 0.60–1.60)

## 2020-08-01 ENCOUNTER — Other Ambulatory Visit: Payer: Self-pay

## 2020-08-01 ENCOUNTER — Encounter (HOSPITAL_BASED_OUTPATIENT_CLINIC_OR_DEPARTMENT_OTHER): Payer: Self-pay

## 2020-08-01 ENCOUNTER — Ambulatory Visit (HOSPITAL_BASED_OUTPATIENT_CLINIC_OR_DEPARTMENT_OTHER)
Admission: RE | Admit: 2020-08-01 | Discharge: 2020-08-01 | Disposition: A | Discharge status: Home / Self Care | Payer: No Typology Code available for payment source | Source: Ambulatory Visit | Attending: Nurse Practitioner | Admitting: Nurse Practitioner

## 2020-08-01 ENCOUNTER — Other Ambulatory Visit (HOSPITAL_BASED_OUTPATIENT_CLINIC_OR_DEPARTMENT_OTHER): Payer: Self-pay | Admitting: Nurse Practitioner

## 2020-08-01 DIAGNOSIS — Z1231 Encounter for screening mammogram for malignant neoplasm of breast: Secondary | ICD-10-CM

## 2020-08-01 DIAGNOSIS — R921 Mammographic calcification found on diagnostic imaging of breast: Secondary | ICD-10-CM | POA: Insufficient documentation

## 2020-08-05 ENCOUNTER — Other Ambulatory Visit: Payer: Self-pay | Admitting: Nurse Practitioner

## 2020-08-05 DIAGNOSIS — R928 Other abnormal and inconclusive findings on diagnostic imaging of breast: Secondary | ICD-10-CM

## 2020-08-06 ENCOUNTER — Encounter: Payer: Self-pay | Admitting: Nurse Practitioner

## 2020-08-07 NOTE — Telephone Encounter (Signed)
Please see message and advise.  Thank you. ° °

## 2020-08-08 ENCOUNTER — Other Ambulatory Visit (HOSPITAL_BASED_OUTPATIENT_CLINIC_OR_DEPARTMENT_OTHER): Payer: Self-pay

## 2020-08-23 ENCOUNTER — Ambulatory Visit
Admission: RE | Admit: 2020-08-23 | Discharge: 2020-08-23 | Disposition: A | Discharge status: Home / Self Care | Payer: No Typology Code available for payment source | Source: Ambulatory Visit | Attending: Nurse Practitioner | Admitting: Nurse Practitioner

## 2020-08-23 ENCOUNTER — Other Ambulatory Visit: Payer: Self-pay | Admitting: Nurse Practitioner

## 2020-08-23 ENCOUNTER — Other Ambulatory Visit: Payer: Self-pay

## 2020-08-23 DIAGNOSIS — R921 Mammographic calcification found on diagnostic imaging of breast: Secondary | ICD-10-CM

## 2020-08-23 DIAGNOSIS — R928 Other abnormal and inconclusive findings on diagnostic imaging of breast: Secondary | ICD-10-CM

## 2020-09-02 ENCOUNTER — Ambulatory Visit
Admission: RE | Admit: 2020-09-02 | Discharge: 2020-09-02 | Disposition: A | Discharge status: Home / Self Care | Payer: No Typology Code available for payment source | Source: Ambulatory Visit | Attending: Nurse Practitioner | Admitting: Nurse Practitioner

## 2020-09-02 ENCOUNTER — Other Ambulatory Visit: Payer: Self-pay

## 2020-09-02 DIAGNOSIS — R921 Mammographic calcification found on diagnostic imaging of breast: Secondary | ICD-10-CM

## 2020-10-21 MED FILL — Estradiol Valerate-Dienogest Tab 3 MG /2-2 MG/2-3 MG/1 MG: ORAL | 84 days supply | Qty: 84 | Fill #0 | Status: AC

## 2020-10-22 ENCOUNTER — Other Ambulatory Visit (HOSPITAL_COMMUNITY): Payer: Self-pay

## 2020-10-30 ENCOUNTER — Telehealth: Payer: No Typology Code available for payment source | Admitting: Physician Assistant

## 2020-10-30 ENCOUNTER — Other Ambulatory Visit (HOSPITAL_COMMUNITY): Payer: Self-pay

## 2020-10-30 ENCOUNTER — Other Ambulatory Visit: Payer: Self-pay | Admitting: Internal Medicine

## 2020-10-30 DIAGNOSIS — J301 Allergic rhinitis due to pollen: Secondary | ICD-10-CM | POA: Diagnosis not present

## 2020-10-30 MED ORDER — FLUTICASONE PROPIONATE 50 MCG/ACT NA SUSP
2.0000 | Freq: Every day | NASAL | 0 refills | Status: DC
  Filled 2020-10-30: qty 16, 30d supply, fill #0

## 2020-10-30 NOTE — Progress Notes (Signed)
E visit for Allergic Rhinitis We are sorry that you are not feeling well.  Here is how we plan to help!  Based on what you have shared with me it looks like you have Allergic Rhinitis.  Rhinitis is when a reaction occurs that causes nasal congestion, runny nose, sneezing, and itching.  Most types of rhinitis are caused by an inflammation and are associated with symptoms in the eyes ears or throat. There are several types of rhinitis.  The most common are acute rhinitis, which is usually caused by a viral illness, allergic or seasonal rhinitis, and nonallergic or year-round rhinitis.  Nasal allergies occur certain times of the year.  Allergic rhinitis is caused when allergens in the air trigger the release of histamine in the body.  Histamine causes itching, swelling, and fluid to build up in the fragile linings of the nasal passages, sinuses and eyelids.  An itchy nose and clear discharge are common.  I recommend the following over the counter treatments: You should take a daily dose of antihistamine and Allegra 60 mg twice daily  I also would recommend a nasal spray: Flonase 2 sprays into each nostril once daily - I have sent in a refill of the Flonase. We do not do ongoing refills of medication so you will need to reach out to your PCP office to request additional refills.   You may also benefit from eye drops such as: Systane 1-2 driops each eye twice daily as needed  HOME CARE:  You can use an over-the-counter saline nasal spray as needed Avoid areas where there is heavy dust, mites, or molds Stay indoors on windy days during the pollen season Keep windows closed in home, at least in bedroom; use air conditioner. Use high-efficiency house air filter Keep windows closed in car, turn AC on re-circulate Avoid playing out with dog during pollen season  GET HELP RIGHT AWAY IF:  If your symptoms do not improve within 10 days You become short of breath You develop yellow or green discharge  from your nose for over 3 days You have coughing fits  MAKE SURE YOU:  Understand these instructions Will watch your condition Will get help right away if you are not doing well or get worse  Thank you for choosing an e-visit. Your e-visit answers were reviewed by a board certified advanced clinical practitioner to complete your personal care plan. Depending upon the condition, your plan could have included both over the counter or prescription medications. Please review your pharmacy choice. Be sure that the pharmacy you have chosen is open so that you can pick up your prescription now.  If there is a problem you may message your provider in [place] to have the prescription routed to another pharmacy. Your safety is important to [place]. If you have drug allergies check your prescription carefully.  For the next 24 hours, you can use MyChart to ask questions about today's visit, request a non-urgent call back, or ask for a work or school excuse from your e-visit provider. You will get an email in the next two days asking about your experience. I hope that your e-visit has been valuable and will speed your recovery.

## 2020-10-30 NOTE — Progress Notes (Signed)
I have spent 5 minutes in review of e-visit questionnaire, review and updating patient chart, medical decision making and response to patient.   Terah Robey Cody Kayshawn Ozburn, PA-C    

## 2020-11-01 ENCOUNTER — Other Ambulatory Visit (HOSPITAL_COMMUNITY): Payer: Self-pay

## 2020-11-01 MED ORDER — SYNTHROID 125 MCG PO TABS
ORAL_TABLET | ORAL | 1 refills | Status: DC
  Filled 2020-11-01: qty 90, 90d supply, fill #0
  Filled 2021-01-27: qty 90, 90d supply, fill #1

## 2021-01-06 NOTE — Progress Notes (Signed)
Patient ID: Christy [name], female   DOB: 1970-11-29, 50 y.o.   MRN: [id number]  This visit occurred during the SARS-CoV-2 public health emergency.  Safety protocols were in place, including screening questions prior to the visit, additional usage of staff PPE, and extensive cleaning of exam room while observing appropriate contact time as indicated for disinfecting solutions.   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 She was seen by Dr Chalmers Cater in the past.  Last visit with me 1 year ago.  Interim history: She got Covid 1 mo ago - quite severe, still recovers from it. She may have delayed few LT4 doses. Still having fatigue, some congestion. She craves OJ. She is OTW feeling well.   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 Almetta Lovely note, stimulated Tg was 2.9 08/10/2008 Thyrogen stimulated WBS negative 10/15/2009 ultrasound neck with 3 abnormal cervical lymph nodes 11/08/2009 FNA of one of the lymph node that was found to be without fatty hilum: negative for metastases - she had a traumatic experience then and would not want to repeat this 09/26/2010 Thyrogen stimulated WBS negative except uptake in colon 10/07/2012 Thyrogen stimulated WBS negative  ! Patient has a Thyrogen allergy (pain and redness at the inj site) >> needs pretreatment with steroids 10/02/2013: Tg 0.2, ATA <1 10/16/2013: Neck U/S:  Small area of irregular tissue in the left thyroid bed  measures roughly 1 cm in length and 0.5 cm in width. This is very ill-defined appearing and may represent scar tissue. Thyroid carcinoma recurrence cannot be completely excluded. Prior whole body nuclear medicine study was negative for any thyroid activity in the neck. No abnormal tissue is identified in the right thyroid bed. No abnormal lymph nodes are identified. 09/05/2014: CT neck and chest: 1. Negative neck CT status post thyroidectomy. No soft tissue mass or lymphadenopathy corresponding to the right supraclavicular palpable area of concern, or elsewhere in the neck. 2. Negative chest CT.  No acute or metastatic process identified. 03/06/2020: Neck U/S: No suspicious masses  Also: 07/14/2006: Tg 1, ATA 3.6 03/2007: Tg <0.5, Stimulated Tg 2.7 12/2007: Tg <0.5, TSH 0.1 2013: Tg <0.5, ATA <20 09/16/2012: TSH 0.644, Tg 0.1, ATA <1 10/07/2012: Tg 2.0, ATA <1 06/28/2013: TSH 1.1  12/12/2014: TSH 1.45, 12/25/2014: ATA <1, Tg(RIA) <2.0  Most recent labs: Lab Results  Component Value Date   TSH 1.69 07/29/2020   TSH 7.51 (H) 05/06/2020   TSH 3.26 01/02/2020   TSH 1.75 12/09/2018   TSH 0.67 12/10/2017   TSH 1.84 08/30/2017   TSH 1.43 04/23/2017   TSH 0.21 (L) 02/19/2017   TSH 0.22 (L) 12/11/2016   TSH 1.31 12/12/2015   FREET4 1.24 07/29/2020   FREET4 1.25 05/06/2020   FREET4 1.19 01/02/2020   FREET4 1.31 12/09/2018   FREET4 1.34 12/10/2017   FREET4 1.28 04/23/2017   FREET4 1.53 02/19/2017   FREET4 1.42 12/11/2016   FREET4 1.30 12/12/2015   FREET4 1.20 12/12/2014   Lab Results  Component Value Date   THYROGLB 0.2 (L) 05/06/2020   THYROGLB 0.2 (L) 01/02/2020  THYROGLB 0.1 (L) 12/09/2018   THYROGLB 0.1 (L) 12/10/2017   THYROGLB <0.1 (L) 12/11/2016   THYROGLB 0.2 (L) 12/12/2015   THYROGLB <2.0 12/25/2014   THGAB <1 05/06/2020   THGAB <1 01/02/2020   THGAB <1 12/09/2018   THGAB <1 12/10/2017   THGAB <1.0 12/11/2016   THGAB <1.0 12/12/2015   THGAB <1.0  12/25/2014   Pt is on Synthroid 125 d.a.w. mcg daily (increased since last visit), taken: - in am - fasting - at least 45-60 min from b'fast - no Ca, Fe, PPIs - stopped MVIs at night - not on Biotin  Pt denies: - feeling nodules in neck - hoarseness - dysphagia - choking - SOB with lying down  She had a low vit D per ObGyn >> started vitamin D - now off.  She is working from home and has time to go for walks frequently.  ROS: Constitutional: no weight gain/no weight loss, + fatigue, no subjective hyperthermia, no subjective hypothermia Eyes: no blurry vision, no xerophthalmia ENT: no sore throat, + see HPI, + congestion Cardiovascular: no CP/no SOB/no palpitations/no leg swelling Respiratory: no cough/no SOB/no wheezing Gastrointestinal: no N/no V/no D/no C/no acid reflux Musculoskeletal: no muscle aches/no joint aches Skin: no rashes, no hair loss Neurological: no tremors/no numbness/no tingling/no dizziness  I reviewed pt's medications, allergies, PMH, social hx, family hx, and changes were documented in the history of present illness. Otherwise, unchanged from my initial visit note.  Past Medical History:  Diagnosis Date   Thyroid cancer ([place]) 2004   s/p thyroidectomy    History   Social History   Marital Status: Married    Spouse Name: N/A    Number of Children: 2   Occupational History   Systems analyst - [name] History Main Topics   Smoking status: Never Smoker    Smokeless tobacco: No   Alcohol Use: Occasional: wine, beer   Drug Use: No  Exercise: walk/run - daily   Current Outpatient Medications on File Prior to Visit  Medication Sig Dispense Refill   cetirizine (ZYRTEC) 10 MG tablet Take 1 tablet (10 mg total) by mouth daily. 30 tablet 11   Diclofenac Sodium (PENNSAID) 2 % SOLN Place 1 application onto the skin 2 (two) times daily. (Patient not taking: Reported on 05/13/2020) 1 Bottle 3   diphenhydrAMINE (BENADRYL ALLERGY) 25 mg capsule       fluticasone (FLONASE) 50 MCG/ACT nasal spray Place 2 sprays into both nostrils daily. 16 g 0   Ibuprofen-Famotidine 800-26.6 MG TABS Take 1 tablet by mouth 3 (three) times daily. (Patient not taking: Reported on 12/09/2018) 90 tablet 3   Melatonin 3 MG TABS Take by mouth.     NATAZIA 3/2-2/2-3/1 MG tablet TAKE 1 TABLET BY MOUTH DAILY. 84 tablet 4   SYNTHROID 125 MCG tablet TAKE 1 TABLET BY MOUTH EVERY MORNING BEFORE BREAKFAST 90 tablet 1   No current facility-administered medications on file prior to visit.   Allergies  Allergen Reactions   Neosporin [Neomycin-Bacitracin Zn-Polymyx]    Thyrogen [Thyrotropin] Other (See Comments)    Thyrogen allergy >> needs pretreatment with steroids   FH: H/o heart ds. in father  PE: BP 120/70 (BP Location: Right Arm, Patient Position: Sitting, Cuff Size: Normal)   Pulse (!) 101   Ht '5\' 5"'$  (1.651 m)   Wt 144 lb 3.2 oz (65.4 kg)   SpO2 98%   BMI 24.00 kg/m  Wt Readings from Last 3 Encounters:  01/07/21 144  lb 3.2 oz (65.4 kg)  05/13/20 142 lb 12.8 oz (64.8 kg)  01/02/20 141 lb (64 kg)   Constitutional: normal weight, in NAD Eyes: PERRLA, EOMI, no exophthalmos ENT: moist mucous membranes, no neck masses palpated, no cervical lymphadenopathy Cardiovascular: RRR, No MRG Respiratory: CTA B Gastrointestinal: abdomen soft, NT, ND, BS+ Musculoskeletal: no deformities, strength intact in all 4 Skin: moist, warm, no rashes Neurological: no tremor with outstretched hands, DTR normal in all 4   ASSESSMENT: 1. Follicular thyroid cancer  2. Postsurgical Hypothyroidism  PLAN:  1. Follicular ThyCa -In remission - Her antithyroglobulin antibodies are negative, but they have been positive in 2008. -At last visit, thyroglobulin level was stable, and 0.2, with negative ATA's.  Previous thyroglobulin was 0.1. -We repeated her thyroglobulin level in 04/2020 and the thyroglobulin level was stable, at 0.2, which is lower than she had in 2008-2016. At  that time, TSH was slightly elevated, at 7.5, but this normalized to 1.69 on 07/29/2020. -Latest thyroid ultrasound report from 2015 showed: Scar tissue versus thyroid remnant in thyroid bed, but this has been present since her surgery, per records from Dr. Chalmers Cater.  This was the reason for many whole-body scans and PET scans that she had in the past.  She had a chest and neck CT scan in 2016, with contrast, which was negative for metastasis or recurrence.  At last visit, we ordered another neck ultrasound which she had on 03/06/2020 and this was also negative for any suspicious masses. -She denies neck compression symptoms -At today's visit we will repeat her thyroglobulin and ATA -However, I suggested to repeat a PET scan at this visit to make sure we are not missing any suspicious activity focus.  2. Postsurgical hypothyroidism, on brand-name Synthroid therapy - latest thyroid labs reviewed with pt. >> normal: Lab Results  Component Value Date   TSH 1.69 07/29/2020  - she continues on LT4 125 mcg daily - pt feels good on this dose - did not feel a difference after increasing the dose - we discussed about taking the thyroid hormone every day, with water, >30 minutes before breakfast, separated by >4 hours from acid reflux medications, calcium, iron, multivitamins. Pt. is taking it correctly. I did advise her that if she starts MVI, to take them later in the day - will check thyroid tests today: TSH and fT4 - If labs are abnormal, she will need to return for repeat TFTs in 1.5 months  Component     Latest Ref Rng & Units 01/07/2021  Thyroglobulin     ng/mL 0.1 (L)  TSH     0.35 - 5.50 uIU/mL 1.10  T4,Free(Direct)     0.60 - 1.60 ng/dL 1.22  Thyroglobulin Ab     < or = 1 IU/mL <1   TFTs at goal, Tg slightly lower - will continue to manage her expectantly for now.  Philemon Kingdom, MD PhD [facility] Endocr[facility]

## 2021-01-07 ENCOUNTER — Other Ambulatory Visit: Payer: Self-pay

## 2021-01-07 ENCOUNTER — Ambulatory Visit (INDEPENDENT_AMBULATORY_CARE_PROVIDER_SITE_OTHER): Payer: No Typology Code available for payment source | Admitting: Internal Medicine

## 2021-01-07 ENCOUNTER — Encounter: Payer: Self-pay | Admitting: Internal Medicine

## 2021-01-07 VITALS — BP 120/70 | HR 101 | Ht 65.0 in | Wt 144.2 lb

## 2021-01-07 DIAGNOSIS — C73 Malignant neoplasm of thyroid gland: Secondary | ICD-10-CM

## 2021-01-07 DIAGNOSIS — E032 Hypothyroidism due to medicaments and other exogenous substances: Secondary | ICD-10-CM

## 2021-01-07 LAB — T4, FREE: Free T4: 1.22 ng/dL (ref 0.60–1.60)

## 2021-01-07 LAB — TSH: TSH: 1.1 u[IU]/mL (ref 0.35–5.50)

## 2021-01-07 NOTE — Patient Instructions (Addendum)
Please stop at the lab.  Please continue the current Synthroid dose (125 mcg) daily.  Take the thyroid hormone every day, with water, at least 30 minutes before breakfast, separated by at least 4 hours from: - acid reflux medications - calcium - iron - multivitamins  Please return in 1 year. 

## 2021-01-08 LAB — THYROGLOBULIN LEVEL: Thyroglobulin: 0.1 ng/mL — ABNORMAL LOW

## 2021-01-08 LAB — THYROGLOBULIN ANTIBODY: Thyroglobulin Ab: <1 IU/mL (ref <=1)

## 2021-01-09 ENCOUNTER — Telehealth: Payer: No Typology Code available for payment source | Admitting: Family Medicine

## 2021-01-09 DIAGNOSIS — R059 Cough, unspecified: Secondary | ICD-10-CM

## 2021-01-09 NOTE — Progress Notes (Signed)
[place] Patient has concerning symptoms that warrant in office assessment.

## 2021-01-10 ENCOUNTER — Ambulatory Visit (INDEPENDENT_AMBULATORY_CARE_PROVIDER_SITE_OTHER): Payer: No Typology Code available for payment source | Admitting: Family Medicine

## 2021-01-10 ENCOUNTER — Other Ambulatory Visit (HOSPITAL_COMMUNITY): Payer: Self-pay

## 2021-01-10 ENCOUNTER — Encounter: Payer: Self-pay | Admitting: Family Medicine

## 2021-01-10 ENCOUNTER — Other Ambulatory Visit: Payer: Self-pay

## 2021-01-10 VITALS — BP 116/88 | HR 92 | Temp 97.4°F | Ht 65.0 in | Wt 143.8 lb

## 2021-01-10 DIAGNOSIS — J301 Allergic rhinitis due to pollen: Secondary | ICD-10-CM

## 2021-01-10 DIAGNOSIS — U099 Post covid-19 condition, unspecified: Secondary | ICD-10-CM | POA: Diagnosis not present

## 2021-01-10 DIAGNOSIS — R053 Chronic cough: Secondary | ICD-10-CM

## 2021-01-10 MED ORDER — PROMETHAZINE-CODEINE 6.25-10 MG/5ML PO SYRP
2.5000 mL | ORAL_SOLUTION | Freq: Every evening | ORAL | 0 refills | Status: DC | PRN
  Filled 2021-01-10: qty 120, 24d supply, fill #0

## 2021-01-10 MED ORDER — FLUTICASONE PROPIONATE 50 MCG/ACT NA SUSP
2.0000 | Freq: Every day | NASAL | 0 refills | Status: DC
  Filled 2021-01-10: qty 16, 30d supply, fill #0

## 2021-01-10 NOTE — Progress Notes (Signed)
[facility] [address] [place] [address] Dept: [phone] Dept Fax: [phone]  Office Visit  Subjective:    Patient ID: Christy [name], female    DOB: March 10, 1971, 50 y.o..   MRN: [id number]  Chief Complaint  Patient presents with   Follow-up    5 wk F/u covid positve. Pt c/o consistent cough, with post-nasal drip. Pain in throat from coughing too much    History of Present Illness:  Patient is in today for assessment of a persistent cough s/p COVID. Ms. Heidel contracted COVID about 5 weeks ago. Her initially symptoms seemed like her allergies. She then developed fatigue, body aches, cough, and congestion. Her temps remained below 100 F. Although most of her symptoms resolved, she has had lingering issues with fatigue and a persistent cough. She notes a feeling like she frequently needs to clear her throat. She was vaccinated against COVID (2 primary, 1 booster). She does not smoke cigarettes. She does take Zyrtec and uses Flonase. Recently, she has also used some Benadryl at bedtime. She had some left over codeine cough syrup. She took 1/2 tsp last night and did have less cough overnight, so felt more rested. She does notes some sore muscles in her neck and has what she feels is a pulled muscle in her lower left chest near the lower rib margin.  Past Medical History: Patient Active Problem List   Diagnosis Date Noted   Right hip pain 08/27/2017   Skin mole 08/27/2017   Chronic seasonal allergic rhinitis due to pollen 08/27/2017   Freckled skin 08/27/2017   Thyroid cancer ([place]) 10/02/2013   Iatrogenic hypothyroidism 10/02/2013   Past Surgical History:  Procedure Laterality Date   THYROIDECTOMY  2004   TONSILLECTOMY     Family History  Problem Relation Age of Onset   Arthritis Mother    Colon polyps Mother    Heart disease Maternal Grandfather    Heart disease Paternal Grandfather    Heart disease  Father    Colon polyps Father    Dementia Maternal Grandmother    Outpatient Medications Prior to Visit  Medication Sig Dispense Refill   cetirizine (ZYRTEC) 10 MG tablet Take 1 tablet (10 mg total) by mouth daily. 30 tablet 11   Diclofenac Sodium (PENNSAID) 2 % SOLN Place 1 application onto the skin 2 (two) times daily. 1 Bottle 3   diphenhydrAMINE (BENADRYL) 25 mg capsule      Melatonin 3 MG TABS Take by mouth.     NATAZIA 3/2-2/2-3/1 MG tablet TAKE 1 TABLET BY MOUTH DAILY. 84 tablet 4   SYNTHROID 125 MCG tablet TAKE 1 TABLET BY MOUTH EVERY MORNING BEFORE BREAKFAST 90 tablet 1   fluticasone (FLONASE) 50 MCG/ACT nasal spray Place 2 sprays into both nostrils daily. 16 g 0   No facility-administered medications prior to visit.   Allergies  Allergen Reactions   Neosporin [Neomycin-Bacitracin Zn-Polymyx]    Thyrogen [Thyrotropin] Other (See Comments)    Thyrogen allergy >> needs pretreatment with steroids     Objective:   Today's Vitals   01/10/21 1132  BP: 116/88  Pulse: 92  Temp: (!) 97.4 F (36.3 C)  TempSrc: Temporal  SpO2: 98%  Weight: 143 lb 12.8 oz (65.2 kg)  Height: '5\' 5"'$  (1.651 m)   Body mass index is 23.93 kg/m.   General: Well developed, well nourished. No acute distress. HEENT: Normocephalic, non-traumatic. Eyes clear. External ears normal. EAC and TMs normal   bilaterally.  Nasal mucosa red and swollen with scant whitish mucous. Mucous membranes moist.   Oropharynx clear. Good dentition. Neck: Supple. No lymphadenopathy. No thyromegaly. Lungs: Clear to auscultation bilaterally. No wheezing, rales or rhonchi. CV: RRR without murmurs or rubs. Pulses 2+ bilaterally. Chest Wall: Point tenderness over the left lower rib margin Psych: Alert and oriented. Normal mood and affect.  Health Maintenance Due  Topic Date Due   Pneumococcal Vaccine 85-62 Years old (1 - PCV) Never done   HIV Screening  Never done   Hepatitis C Screening  Never done   COLONOSCOPY (Pts  45-38yr Insurance coverage will need to be confirmed)  Never done   PAP SMEAR-Modifier  03/11/2019   COVID-19 Vaccine (4 - Booster for Pfizer series) 06/08/2020   INFLUENZA VACCINE  12/16/2020     Assessment & Plan:   1. COVID-19 long hauler manifesting chronic cough Ms. Reise appears to have a post-bronchitic cough that is persisting. I hear no wheezing or rales on chest exam, so do not feel a chest x-ray would be helpful. I also don't think an inhaler will help her cough. This should resolve with time. She has not responded to Tessalon int he past. I will renew some codeine cough syrup for judicious use.  - promethazine-codeine (PHENERGAN WITH CODEINE) 6.25-10 MG/5ML syrup; Take 2.5 mLs by mouth at bedtime as needed and may repeat dose one time if needed for cough.  Dispense: 120 mL; Refill: 0  2. Chronic seasonal allergic rhinitis due to pollen Continue Zyrtec and Flonase.  - fluticasone (FLONASE) 50 MCG/ACT nasal spray; Place 2 sprays into both nostrils daily.  Dispense: 16 g; Refill: 0  SHaydee Salter MD

## 2021-01-27 ENCOUNTER — Other Ambulatory Visit: Payer: Self-pay | Admitting: Family Medicine

## 2021-01-27 ENCOUNTER — Other Ambulatory Visit (HOSPITAL_COMMUNITY): Payer: Self-pay

## 2021-01-27 DIAGNOSIS — J301 Allergic rhinitis due to pollen: Secondary | ICD-10-CM

## 2021-01-27 MED ORDER — FLUTICASONE PROPIONATE 50 MCG/ACT NA SUSP
2.0000 | Freq: Every day | NASAL | 0 refills | Status: DC
  Filled 2021-01-27: qty 16, 30d supply, fill #0

## 2021-01-27 MED FILL — Estradiol Valerate-Dienogest Tab 3 MG /2-2 MG/2-3 MG/1 MG: ORAL | 84 days supply | Qty: 84 | Fill #1 | Status: AC

## 2021-01-28 ENCOUNTER — Other Ambulatory Visit (HOSPITAL_COMMUNITY): Payer: Self-pay

## 2021-02-03 ENCOUNTER — Other Ambulatory Visit (HOSPITAL_COMMUNITY): Payer: Self-pay

## 2021-03-03 ENCOUNTER — Other Ambulatory Visit (HOSPITAL_BASED_OUTPATIENT_CLINIC_OR_DEPARTMENT_OTHER): Payer: Self-pay

## 2021-03-03 ENCOUNTER — Other Ambulatory Visit: Payer: Self-pay

## 2021-03-03 ENCOUNTER — Ambulatory Visit: Payer: No Typology Code available for payment source | Attending: Internal Medicine

## 2021-03-03 DIAGNOSIS — Z23 Encounter for immunization: Secondary | ICD-10-CM

## 2021-03-03 MED ORDER — PFIZER COVID-19 VAC BIVALENT 30 MCG/0.3ML IM SUSP
INTRAMUSCULAR | 0 refills | Status: DC
  Filled 2021-03-03: qty 0.3, 1d supply, fill #0

## 2021-03-03 NOTE — Progress Notes (Signed)
   Covid-19 Vaccination Clinic  Name:  Christy [name]    MRN: [id number] DOB: 18-May-1971  03/03/2021  Christy [name] was observed post Covid-19 immunization for 15 minutes without incident. She was provided with Vaccine Information Sheet and instruction to access the V-Safe system.   Christy [name] was instructed to call 911 with any severe reactions post vaccine: Difficulty breathing  Swelling of face and throat  A fast heartbeat  A bad rash all over body  Dizziness and weakness

## 2021-04-14 ENCOUNTER — Ambulatory Visit: Payer: No Typology Code available for payment source | Admitting: Nurse Practitioner

## 2021-04-17 ENCOUNTER — Other Ambulatory Visit: Payer: Self-pay | Admitting: Family Medicine

## 2021-04-17 DIAGNOSIS — J301 Allergic rhinitis due to pollen: Secondary | ICD-10-CM

## 2021-04-17 MED FILL — Estradiol Valerate-Dienogest Tab 3 MG /2-2 MG/2-3 MG/1 MG: ORAL | 84 days supply | Qty: 84 | Fill #2 | Status: AC

## 2021-04-18 ENCOUNTER — Other Ambulatory Visit (HOSPITAL_COMMUNITY): Payer: Self-pay

## 2021-04-18 MED ORDER — FLUTICASONE PROPIONATE 50 MCG/ACT NA SUSP
2.0000 | Freq: Every day | NASAL | 2 refills | Status: DC
  Filled 2021-04-18: qty 16, 30d supply, fill #0
  Filled 2021-06-03: qty 16, 30d supply, fill #1
  Filled 2021-07-21: qty 16, 30d supply, fill #2

## 2021-04-21 ENCOUNTER — Other Ambulatory Visit (HOSPITAL_COMMUNITY): Payer: Self-pay

## 2021-04-28 ENCOUNTER — Other Ambulatory Visit: Payer: Self-pay | Admitting: Internal Medicine

## 2021-04-28 ENCOUNTER — Other Ambulatory Visit (HOSPITAL_COMMUNITY): Payer: Self-pay

## 2021-04-28 MED ORDER — SYNTHROID 125 MCG PO TABS
ORAL_TABLET | ORAL | 1 refills | Status: DC
  Filled 2021-04-28: qty 90, 90d supply, fill #0
  Filled 2021-07-29: qty 90, 90d supply, fill #1

## 2021-06-03 ENCOUNTER — Other Ambulatory Visit (HOSPITAL_COMMUNITY): Payer: Self-pay

## 2021-06-06 ENCOUNTER — Ambulatory Visit (INDEPENDENT_AMBULATORY_CARE_PROVIDER_SITE_OTHER): Payer: No Typology Code available for payment source | Admitting: Medical

## 2021-06-06 ENCOUNTER — Encounter (HOSPITAL_BASED_OUTPATIENT_CLINIC_OR_DEPARTMENT_OTHER): Payer: Self-pay | Admitting: Medical

## 2021-06-06 ENCOUNTER — Other Ambulatory Visit (HOSPITAL_BASED_OUTPATIENT_CLINIC_OR_DEPARTMENT_OTHER): Payer: Self-pay

## 2021-06-06 ENCOUNTER — Other Ambulatory Visit (HOSPITAL_COMMUNITY): Payer: Self-pay

## 2021-06-06 VITALS — BP 122/82 | HR 82 | Ht 65.0 in | Wt 143.0 lb

## 2021-06-06 DIAGNOSIS — Z01419 Encounter for gynecological examination (general) (routine) without abnormal findings: Secondary | ICD-10-CM | POA: Diagnosis not present

## 2021-06-06 DIAGNOSIS — Z1231 Encounter for screening mammogram for malignant neoplasm of breast: Secondary | ICD-10-CM

## 2021-06-06 DIAGNOSIS — Z1211 Encounter for screening for malignant neoplasm of colon: Secondary | ICD-10-CM

## 2021-06-06 MED ORDER — NATAZIA 3/2-2/2-3/1 MG PO TABS
1.0000 | ORAL_TABLET | Freq: Every day | ORAL | 4 refills | Status: DC
  Filled 2021-06-06 – 2021-07-21 (×3): qty 84, 84d supply, fill #0
  Filled 2021-10-20: qty 84, 84d supply, fill #1
  Filled 2021-12-29: qty 84, 84d supply, fill #2
  Filled 2022-04-05: qty 84, 84d supply, fill #3
  Filled 2022-05-04 – 2022-06-01 (×2): qty 84, 84d supply, fill #4

## 2021-06-06 NOTE — Progress Notes (Signed)
History:  [name] is a 51 y.o. G2P0020 who presents to clinic today for annual exam and to establish care. She was previously seen by Dr. Elly Modena at [facility]. Her last pap smear was normal 05/09/2019, records will be scanned into her chart. She states a remote history of abnormal pap smear with HPV in college that was treated with CKC procedure. HPV was not performed on last pap smear. She declines the need for STD testing today. She denies abnormal discharge, bleeding or bladder concerns. She states regular very light periods with mild cramping each month. She also states occasional mild constipation which she is managing with increased fiber. She had a mammogram in early 2022 which required further testing and eventually a biopsy which was benign. She has a history of thyroidectomy. She is due for a colonoscopy.   The following portions of the patient's history were reviewed and updated as appropriate: allergies, current medications, family history, past medical history, social history, past surgical history and problem list.  Review of Systems:  Review of Systems  Constitutional:  Negative for chills and fever.  Gastrointestinal:  Positive for constipation. Negative for abdominal pain.  Genitourinary:  Negative for dysuria, frequency and urgency.       Neg - vaginal bleeding, discharge     Objective:  Physical Exam BP 122/82    Pulse 82    Ht 5\' 5"  (1.651 m)    Wt 143 lb (64.9 kg)    LMP 05/29/2021    BMI 23.80 kg/m  Physical Exam Vitals and nursing note reviewed. Exam conducted with a chaperone present.  Constitutional:      General: She is not in acute distress.    Appearance: She is well-developed and normal weight.  HENT:     Head: Normocephalic and atraumatic.  Cardiovascular:     Rate and Rhythm: Normal rate and regular rhythm.     Heart sounds: No murmur heard. Pulmonary:     Effort: Pulmonary effort is normal. No respiratory distress.     Breath sounds:  Normal breath sounds. No wheezing.  Abdominal:     General: Abdomen is flat. Bowel sounds are normal. There is no distension.     Palpations: Abdomen is soft. There is no mass.     Tenderness: There is no abdominal tenderness. There is no guarding.     Hernia: No hernia is present.  Genitourinary:    General: Normal vulva.     Vagina: No vaginal discharge or bleeding.     Cervix: Normal.     Uterus: Normal.      Adnexa: Right adnexa normal and left adnexa normal.  Skin:    General: Skin is warm and dry.     Findings: No erythema.     Comments: Multiple nevi of various size and color noted, one on abdomen noted to be raised with discoloration.   Neurological:     Mental Status: She is alert and oriented to person, place, and time.  Psychiatric:        Mood and Affect: Mood normal.    Health Maintenance Due  Topic Date Due   COLONOSCOPY (Pts 45-63yrs Insurance coverage will need to be confirmed)  Never done     Assessment & Plan:  1. Screening for colon cancer - Ambulatory referral to Gastroenterology  2. Screening mammogram for breast cancer - MM Digital Screening; Future  3. Well-woman exam - Next pap after 05/08/2022 - Rx refilled   Patient to return  to [facility] OB[facility]N in 1 year for annual exam or sooner PRN   Approximately 20 minutes of total time was spent with this patient on chart review, history taking, patient education, physical exam, coordination of care and documentation.   Luvenia Redden, PA-C 06/06/2021 11:08 AM

## 2021-07-10 ENCOUNTER — Other Ambulatory Visit (HOSPITAL_COMMUNITY): Payer: Self-pay

## 2021-07-21 ENCOUNTER — Other Ambulatory Visit (HOSPITAL_COMMUNITY): Payer: Self-pay

## 2021-07-22 ENCOUNTER — Other Ambulatory Visit (HOSPITAL_COMMUNITY): Payer: Self-pay

## 2021-07-29 ENCOUNTER — Other Ambulatory Visit (HOSPITAL_COMMUNITY): Payer: Self-pay

## 2021-08-21 ENCOUNTER — Encounter: Payer: Self-pay | Admitting: Internal Medicine

## 2021-09-02 ENCOUNTER — Other Ambulatory Visit (HOSPITAL_COMMUNITY): Payer: Self-pay

## 2021-09-02 ENCOUNTER — Other Ambulatory Visit: Payer: Self-pay | Admitting: Nurse Practitioner

## 2021-09-02 DIAGNOSIS — J301 Allergic rhinitis due to pollen: Secondary | ICD-10-CM

## 2021-09-02 MED ORDER — FLUTICASONE PROPIONATE 50 MCG/ACT NA SUSP
2.0000 | Freq: Every day | NASAL | 2 refills | Status: DC
  Filled 2021-09-02: qty 16, 30d supply, fill #0
  Filled 2021-10-20: qty 16, 30d supply, fill #1
  Filled 2021-12-29: qty 16, 30d supply, fill #2

## 2021-09-11 ENCOUNTER — Other Ambulatory Visit (HOSPITAL_COMMUNITY): Payer: Self-pay

## 2021-09-11 ENCOUNTER — Ambulatory Visit (AMBULATORY_SURGERY_CENTER): Payer: No Typology Code available for payment source | Admitting: *Deleted

## 2021-09-11 VITALS — Ht 65.0 in | Wt 140.0 lb

## 2021-09-11 DIAGNOSIS — Z1211 Encounter for screening for malignant neoplasm of colon: Secondary | ICD-10-CM

## 2021-09-11 MED ORDER — NA SULFATE-K SULFATE-MG SULF 17.5-3.13-1.6 GM/177ML PO SOLN
1.0000 | Freq: Once | ORAL | 0 refills | Status: AC
  Filled 2021-09-11: qty 354, 1d supply, fill #0

## 2021-09-11 NOTE — Progress Notes (Signed)

## 2021-10-06 ENCOUNTER — Encounter: Payer: Self-pay | Admitting: Internal Medicine

## 2021-10-08 ENCOUNTER — Ambulatory Visit (AMBULATORY_SURGERY_CENTER): Payer: No Typology Code available for payment source | Admitting: Internal Medicine

## 2021-10-08 ENCOUNTER — Encounter: Payer: Self-pay | Admitting: Internal Medicine

## 2021-10-08 VITALS — BP 135/75 | HR 72 | Temp 98.0°F | Resp 17 | Ht 65.0 in | Wt 140.0 lb

## 2021-10-08 DIAGNOSIS — D124 Benign neoplasm of descending colon: Secondary | ICD-10-CM

## 2021-10-08 DIAGNOSIS — K635 Polyp of colon: Secondary | ICD-10-CM

## 2021-10-08 DIAGNOSIS — Z1211 Encounter for screening for malignant neoplasm of colon: Secondary | ICD-10-CM | POA: Diagnosis not present

## 2021-10-08 DIAGNOSIS — D123 Benign neoplasm of transverse colon: Secondary | ICD-10-CM

## 2021-10-08 MED ORDER — SODIUM CHLORIDE 0.9 % IV SOLN: 500.0000 mL | Freq: Once | INTRAVENOUS | Status: DC

## 2021-10-08 NOTE — Progress Notes (Signed)
Approx 1140 pt noted to be shivering, 3 blankets were obtained to cover pt.  No temperature taking monitor available

## 2021-10-08 NOTE — Progress Notes (Signed)
Report to PACU, RN, vss, BBS= Clear.  

## 2021-10-08 NOTE — Progress Notes (Signed)
Called to room to assist during endoscopic procedure.  Patient ID and intended procedure confirmed with present staff. Received instructions for my participation in the procedure from the performing physician.  

## 2021-10-08 NOTE — Op Note (Signed)
[address] Patient Name: Christy [name] Procedure Date: 10/08/2021 10:12 AM MRN: [id number] Endoscopist: Sonny Masters "Christy [name] ,  Age: 51 Referring MD:  Date of Birth: 08-22-1970 Gender: Female Account #: [id number] Procedure:                Colonoscopy Indications:              Screening for colorectal malignant neoplasm, This                            is the patient's first colonoscopy Medicines:                Monitored Anesthesia Care Procedure:                Pre-Anesthesia Assessment:                           - Prior to the procedure, a History and Physical                            was performed, and patient medications and                            allergies were reviewed. The patient's tolerance of                            previous anesthesia was also reviewed. The risks                            and benefits of the procedure and the sedation                            options and risks were discussed with the patient.                            All questions were answered, and informed consent                            was obtained. Prior Anticoagulants: The patient has                            taken no previous anticoagulant or antiplatelet                            agents. ASA Grade Assessment: II - A patient with                            mild systemic disease. After reviewing the risks                            and benefits, the patient was deemed in                            satisfactory condition to undergo the procedure.  After obtaining informed consent, the colonoscope                            was passed under direct vision. Throughout the                            procedure, the patient's blood pressure, pulse, and                            oxygen saturations were monitored continuously. The                            Olympus CF-HQ190L [id number]) Colonoscope was                            introduced through the  anus and advanced to the the                            cecum, identified by appendiceal orifice and                            ileocecal valve. The patient tolerated the                            procedure well. The quality of the bowel                            preparation was adequate. The ileocecal valve,                            appendiceal orifice, and rectum were photographed.                            The colonoscopy was performed with difficulty due                            to significant looping and a tortuous colon.                            Successful completion of the procedure was aided by                            changing the patient's position and using manual                            pressure. Scope In: 10:39:06 AM Scope Out: 11:47:59 AM Scope Withdrawal Time: 0 hours 15 minutes 4 seconds  Total Procedure Duration: 1 hour 8 minutes 53 seconds  Findings:                 A 8 mm polyp was found in the transverse colon. The                            polyp was sessile. The polyp was removed with a  cold snare. Resection and retrieval were complete.                           A 3 mm polyp was found in the sigmoid colon. The                            polyp was sessile. The polyp was removed with a                            cold snare. Resection and retrieval were complete.                           Multiple small and large-mouthed diverticula were                            found in the sigmoid colon, descending colon and                            transverse colon.                           Non-bleeding internal hemorrhoids were found during                            retroflexion. Complications:            No immediate complications. Estimated Blood Loss:     Estimated blood loss was minimal. Impression:               - One 8 mm polyp in the transverse colon, removed                            with a cold snare. Resected and  retrieved.                           - One 3 mm polyp in the sigmoid colon, removed with                            a cold snare. Resected and retrieved.                           - Diverticulosis in the sigmoid colon, in the                            descending colon and in the transverse colon.                           - Non-bleeding internal hemorrhoids. Recommendation:           - Discharge patient to home (with escort).                           - Await pathology results.                           -  The findings and recommendations were discussed                            with the patient. Sonny Masters "Christy [name],  10/08/2021 11:56:17 AM

## 2021-10-08 NOTE — Progress Notes (Signed)
Vs by DT  Pt's states no medical or surgical changes since previsit or office visit.  

## 2021-10-08 NOTE — Progress Notes (Signed)
Approx 1120, Dr D made decision to roll pt on her belly.  Monitors had to be unhooked to flip her.  As soon as we got her on her belly, Dr D said it "made it worse" so we had to flip her back over to L lateral decub.  Monitors remained unhooked until we were able to accomplish that (approx 1130).  Pt did wake up during this and helped [place] get her positoned.  I did verbalize to her what was going.  Sedation restarted since still not at cecum

## 2021-10-08 NOTE — Progress Notes (Signed)
GASTROENTEROLOGY PROCEDURE H&P NOTE   Primary Care Physician: Flossie Buffy, NP    Reason for Procedure:   Colon cancer screening  Plan:    Colonoscopy  Patient is appropriate for endoscopic procedure(s) in the ambulatory ([facility]) setting.  The nature of the procedure, as well as the risks, benefits, and alternatives were carefully and thoroughly reviewed with the patient. Ample time for discussion and questions allowed. The patient understood, was satisfied, and agreed to proceed.     HPI: Christy [name] is a 51 y.o. female who presents for colonoscopy for colon cancer screening. Denies blood in stools, changes in bowel habits, weight loss. One of her aunts may have had colon cancer.  Past Medical History:  Diagnosis Date   Allergy    seasonal   Thyroid cancer ([place]) 2004   s/p thyroidectomy     Past Surgical History:  Procedure Laterality Date   CYST EXCISION Right    "on rib cage"   THYROIDECTOMY  05/18/2002   TONSILLECTOMY     age 37    Prior to Admission medications   Medication Sig Start Date End Date Taking? Authorizing Provider  cetirizine (ZYRTEC) 10 MG tablet Take 1 tablet (10 mg total) by mouth daily. 06/17/12  Yes Donnamae Jude, MD  Cholecalciferol (VITAMIN D3 PO) Take 25 mcg by mouth daily.   Yes [provider]  fluticasone (FLONASE) 50 MCG/ACT nasal spray Place 2 sprays into both nostrils daily. 09/02/21  Yes [name], Charlene Brooke, NP  NATAZIA 3/2-2/2-3/1 MG tablet TAKE 1 TABLET BY MOUTH DAILY. 06/06/21 07/31/22 Yes Luvenia Redden, PA-C  SYNTHROID 125 MCG tablet TAKE 1 TABLET BY MOUTH EVERY MORNING BEFORE BREAKFAST 04/28/21 04/28/22 Yes Philemon Kingdom, MD  diphenhydrAMINE (BENADRYL) 25 mg capsule as needed.    [provider]  Melatonin 3 MG TABS Take by mouth.    [provider]  Omega-3 Fatty Acids (FISH OIL PO) Take by mouth as needed.    [provider]    Current Outpatient Medications  Medication Sig  Dispense Refill   cetirizine (ZYRTEC) 10 MG tablet Take 1 tablet (10 mg total) by mouth daily. 30 tablet 11   Cholecalciferol (VITAMIN D3 PO) Take 25 mcg by mouth daily.     fluticasone (FLONASE) 50 MCG/ACT nasal spray Place 2 sprays into both nostrils daily. 16 g 2   NATAZIA 3/2-2/2-3/1 MG tablet TAKE 1 TABLET BY MOUTH DAILY. 84 tablet 4   SYNTHROID 125 MCG tablet TAKE 1 TABLET BY MOUTH EVERY MORNING BEFORE BREAKFAST 90 tablet 1   diphenhydrAMINE (BENADRYL) 25 mg capsule as needed.     Melatonin 3 MG TABS Take by mouth.     Omega-3 Fatty Acids (FISH OIL PO) Take by mouth as needed.     Current Facility-Administered Medications  Medication Dose Route Frequency Provider Last Rate Last Admin   0.9 %  sodium chloride infusion  500 mL Intravenous Once Sharyn Creamer, MD        Allergies as of 10/08/2021 - Review Complete 10/08/2021  Allergen Reaction Noted   Neosporin [neomycin-bacitracin zn-polymyx]  10/04/2012   Thyrogen [thyrotropin] Other (See Comments) 10/12/2013    Family History  Problem Relation Age of Onset   Arthritis Mother    Colon polyps Mother    Heart disease Father    Colon polyps Father    Diverticulitis Sister    Colon cancer Paternal Aunt    Dementia Maternal Grandmother    Heart disease Maternal Grandfather  Heart disease Paternal Grandfather    Crohn's disease Neg Hx    Esophageal cancer Neg Hx    Rectal cancer Neg Hx    Stomach cancer Neg Hx     Social History   Socioeconomic History   Marital status: Married    Spouse name: Not on file   Number of children: 0   Years of education: Not on file   Highest education level: Not on file  Occupational History   Occupation: IT    Employer: [employer]  Tobacco Use   Smoking status: Never   Smokeless tobacco: Never  Vaping Use   Vaping Use: Never used  Substance and Sexual Activity   Alcohol use: Yes    Comment: social   Drug use: Never   Sexual activity: Yes    Birth control/protection: Pill   Other Topics Concern   Not on file  Social History Narrative   Not on file   Social Determinants of Health   Financial Resource Strain: Not on file  Food Insecurity: Not on file  Transportation Needs: Not on file  Physical Activity: Sufficiently Active   Days of Exercise per Week: 7 days   Minutes of Exercise per Session: 50 min  Stress: Not on file  Social Connections: Not on file  Intimate Partner Violence: Not on file    Physical Exam: Vital signs in last 24 hours: BP 126/90   Pulse 78   Temp 98 F (36.7 C)   Resp 16   Ht '5\' 5"'$  (1.651 m)   Wt 140 lb (63.5 kg)   LMP 09/17/2021   SpO2 100%   BMI 23.30 kg/m  GEN: NAD EYE: Sclerae anicteric ENT: MMM CV: Non-tachycardic Pulm: No increased work of breathing GI: Soft, NT/ND NEURO:  Alert & Oriented   [name], MD [facility]  10/08/2021 10:26 AM

## 2021-10-08 NOTE — Patient Instructions (Addendum)
Thank you for coming in to see [place] today! Resume diet and medications today. Return to normal activities tomorrow. Polyp biopsy results will be available in 1-2 weeks with recommendation for next colonoscopy.     YOU HAD AN ENDOSCOPIC PROCEDURE TODAY AT [facility]:   Refer to the procedure report that was given to you for any specific questions about what was found during the examination.  If the procedure report does not answer your questions, please call your gastroenterologist to clarify.  If you requested that your care partner not be given the details of your procedure findings, then the procedure report has been included in a sealed envelope for you to review at your convenience later.  YOU SHOULD EXPECT: Some feelings of bloating in the abdomen. Passage of more gas than usual.  Walking can help get rid of the air that was put into your GI tract during the procedure and reduce the bloating. If you had a lower endoscopy (such as a colonoscopy or flexible sigmoidoscopy) you may notice spotting of blood in your stool or on the toilet paper. If you underwent a bowel prep for your procedure, you may not have a normal bowel movement for a few days.  Please Note:  You might notice some irritation and congestion in your nose or some drainage.  This is from the oxygen used during your procedure.  There is no need for concern and it should clear up in a day or so.  SYMPTOMS TO REPORT IMMEDIATELY:  Following lower endoscopy (colonoscopy or flexible sigmoidoscopy):  Excessive amounts of blood in the stool  Significant tenderness or worsening of abdominal pains  Swelling of the abdomen that is new, acute  Fever of 100F or higher  Following upper endoscopy (EGD)  Vomiting of blood or coffee ground material  New chest pain or pain under the shoulder blades  Painful or persistently difficult swallowing  New shortness of breath  Fever of 100F or higher  Black, tarry-looking  stools  For urgent or emergent issues, a gastroenterologist can be reached at any hour by calling [phone]. Do not use MyChart messaging for urgent concerns.    DIET:  We do recommend a small meal at first, but then you may proceed to your regular diet.  Drink plenty of fluids but you should avoid alcoholic beverages for 24 hours.  ACTIVITY:  You should plan to take it easy for the rest of today and you should NOT DRIVE or use heavy machinery until tomorrow (because of the sedation medicines used during the test).    FOLLOW UP: Our staff will call the number listed on your records 48-72 hours following your procedure to check on you and address any questions or concerns that you may have regarding the information given to you following your procedure. If we do not reach you, we will leave a message.  We will attempt to reach you two times.  During this call, we will ask if you have developed any symptoms of COVID 19. If you develop any symptoms (ie: fever, flu-like symptoms, shortness of breath, cough etc.) before then, please call [phone].  If you test positive for Covid 19 in the 2 weeks post procedure, please call and report this information to [place].    If any biopsies were taken you will be contacted by phone or by letter within the next 1-3 weeks.  Please call us at [phone] if you have not heard about the biopsies in 3  weeks.    SIGNATURES/CONFIDENTIALITY: You and/or your care partner have signed paperwork which will be entered into your electronic medical record.  These signatures attest to the fact that that the information above on your After Visit Summary has been reviewed and is understood.  Full responsibility of the confidentiality of this discharge information lies with you and/or your care-partner.

## 2021-10-08 NOTE — Progress Notes (Signed)
Approx 1052, pt was supine to try to facilitate cecum being reached and she started "gupping" and coughing, with crystal clear fluid being produced.  She was quickly turned to her side.  Sedation haled.  Suctioning started.  Pt was allowed to wake up enough to follow commands to swallow and report no throat burning.  Zofran and Robinul given.  Light sedation restarted since we had not reached cecum yet

## 2021-10-09 ENCOUNTER — Telehealth: Payer: Self-pay

## 2021-10-09 NOTE — Telephone Encounter (Signed)
  Follow up Call-     10/08/2021    9:08 AM  Call back number  Post procedure Call Back phone  # [phone]  Permission to leave phone message Yes     Patient questions:  Do you have a fever, pain , or abdominal swelling? No. Pain Score  0 *  Have you tolerated food without any problems? Yes.    Have you been able to return to your normal activities? Yes.    Do you have any questions about your discharge instructions: Diet   No. Medications  No. Follow up visit  No.  Do you have questions or concerns about your Care? No.  Actions: * If pain score is 4 or above: No action needed, pain <4.

## 2021-10-10 ENCOUNTER — Encounter: Payer: Self-pay | Admitting: Internal Medicine

## 2021-10-14 ENCOUNTER — Other Ambulatory Visit (HOSPITAL_COMMUNITY): Payer: Self-pay

## 2021-10-14 ENCOUNTER — Telehealth: Payer: No Typology Code available for payment source | Admitting: Family Medicine

## 2021-10-14 DIAGNOSIS — R300 Dysuria: Secondary | ICD-10-CM

## 2021-10-14 MED ORDER — NITROFURANTOIN MONOHYD MACRO 100 MG PO CAPS
100.0000 mg | ORAL_CAPSULE | Freq: Two times a day (BID) | ORAL | 0 refills | Status: AC
Start: 2021-10-14 — End: 2021-10-19
  Filled 2021-10-14: qty 10, 5d supply, fill #0

## 2021-10-14 NOTE — Progress Notes (Signed)

## 2021-10-17 ENCOUNTER — Ambulatory Visit (HOSPITAL_BASED_OUTPATIENT_CLINIC_OR_DEPARTMENT_OTHER)
Admission: RE | Admit: 2021-10-17 | Discharge: 2021-10-17 | Disposition: A | Discharge status: Home / Self Care | Payer: No Typology Code available for payment source | Source: Ambulatory Visit | Attending: Medical | Admitting: Medical

## 2021-10-17 DIAGNOSIS — Z1231 Encounter for screening mammogram for malignant neoplasm of breast: Secondary | ICD-10-CM | POA: Diagnosis not present

## 2021-10-20 ENCOUNTER — Other Ambulatory Visit (HOSPITAL_COMMUNITY): Payer: Self-pay

## 2021-10-21 ENCOUNTER — Other Ambulatory Visit (HOSPITAL_COMMUNITY): Payer: Self-pay

## 2021-10-22 ENCOUNTER — Other Ambulatory Visit (HOSPITAL_COMMUNITY): Payer: Self-pay

## 2021-10-26 ENCOUNTER — Other Ambulatory Visit: Payer: Self-pay | Admitting: Internal Medicine

## 2021-10-27 ENCOUNTER — Other Ambulatory Visit (HOSPITAL_COMMUNITY): Payer: Self-pay

## 2021-10-27 MED ORDER — SYNTHROID 125 MCG PO TABS
ORAL_TABLET | ORAL | 0 refills | Status: DC
  Filled 2021-10-27: qty 90, 90d supply, fill #0

## 2021-11-17 ENCOUNTER — Other Ambulatory Visit (HOSPITAL_COMMUNITY): Payer: Self-pay

## 2021-11-17 DIAGNOSIS — J324 Chronic pansinusitis: Secondary | ICD-10-CM | POA: Insufficient documentation

## 2021-11-17 DIAGNOSIS — R0982 Postnasal drip: Secondary | ICD-10-CM | POA: Insufficient documentation

## 2021-11-17 MED ORDER — AZELASTINE HCL 0.1 % NA SOLN
NASAL | 0 refills | Status: DC
  Filled 2021-11-17: qty 30, 25d supply, fill #0

## 2021-11-17 MED ORDER — PREDNISONE 20 MG PO TABS
ORAL_TABLET | ORAL | 0 refills | Status: DC
  Filled 2021-11-17: qty 10, 5d supply, fill #0

## 2021-11-25 ENCOUNTER — Other Ambulatory Visit (HOSPITAL_COMMUNITY): Payer: Self-pay

## 2021-11-25 ENCOUNTER — Encounter: Payer: Self-pay | Admitting: Family Medicine

## 2021-11-25 ENCOUNTER — Ambulatory Visit (INDEPENDENT_AMBULATORY_CARE_PROVIDER_SITE_OTHER): Payer: No Typology Code available for payment source | Admitting: Family Medicine

## 2021-11-25 VITALS — BP 116/78 | HR 86 | Temp 97.3°F | Ht 65.0 in | Wt 144.8 lb

## 2021-11-25 DIAGNOSIS — J301 Allergic rhinitis due to pollen: Secondary | ICD-10-CM | POA: Diagnosis not present

## 2021-11-25 MED ORDER — IPRATROPIUM BROMIDE 0.06 % NA SOLN
2.0000 | Freq: Four times a day (QID) | NASAL | 12 refills | Status: DC
  Filled 2021-11-25: qty 15, 10d supply, fill #0
  Filled 2022-01-13: qty 15, 10d supply, fill #1
  Filled 2022-04-05: qty 15, 30d supply, fill #2

## 2021-11-25 NOTE — Progress Notes (Signed)
[facility] [address] [place] [address] Dept: [phone] Dept Fax: [phone]  Office Visit  Subjective:    Patient ID: Christy [name], female    DOB: November 09, 1970, 51 y.o..   MRN: [id number]  Chief Complaint  Patient presents with   Acute Visit    C/o having real bad snoring, post nasal drip x 4 weeks.      History of Present Illness:  Patient is in today complaining of difficulty with management of her chronic allergies. She notes her baseline allergy management has been to use Zyrtec daily and Flonase nasal spray 1 spray each nostril twice a day. Over the past several weeks, she felt her allergies were doing worse. She was seen in urgent care. They had her switch form Zyrtec to Allegra and switched her Flonase to azelastine. They also placed her on a 5-day course of prednisone. Christy [name] felt some initial improvement while on prednisone, but now feels like she is worse. She worries about her nasal symptoms causing her to snore, disrupting her husband's sleep. She has been intermittently using nasal saline washes and thinks they may help some.  Past Medical History: Patient Active Problem List   Diagnosis Date Noted   Right hip pain 08/27/2017   Skin mole 08/27/2017   Chronic seasonal allergic rhinitis due to pollen 08/27/2017   Freckled skin 08/27/2017   Thyroid cancer ([place]) 10/02/2013   Iatrogenic hypothyroidism 10/02/2013   Past Surgical History:  Procedure Laterality Date   CYST EXCISION Right    "on rib cage"   THYROIDECTOMY  05/18/2002   TONSILLECTOMY     age 42   Family History  Problem Relation Age of Onset   Arthritis Mother    Colon polyps Mother    Heart disease Father    Colon polyps Father    Diverticulitis Sister    Colon cancer Paternal Aunt    Dementia Maternal Grandmother    Heart disease Maternal Grandfather    Heart disease Paternal Grandfather    Crohn's disease Neg Hx     Esophageal cancer Neg Hx    Rectal cancer Neg Hx    Stomach cancer Neg Hx    Outpatient Medications Prior to Visit  Medication Sig Dispense Refill   cetirizine (ZYRTEC) 10 MG tablet Take 1 tablet (10 mg total) by mouth daily. 30 tablet 11   Cholecalciferol (VITAMIN D3 PO) Take 25 mcg by mouth daily.     diphenhydrAMINE (BENADRYL) 25 mg capsule as needed.     fexofenadine (ALLEGRA) 180 MG tablet Take 180 mg by mouth daily.     Melatonin 3 MG TABS Take by mouth.     NATAZIA 3/2-2/2-3/1 MG tablet TAKE 1 TABLET BY MOUTH DAILY. 84 tablet 4   Omega-3 Fatty Acids (FISH OIL PO) Take by mouth as needed.     SYNTHROID 125 MCG tablet TAKE 1 TABLET BY MOUTH EVERY MORNING BEFORE BREAKFAST 90 tablet 0   azelastine (ASTELIN) 0.1 % nasal spray Place 2 sprays in the nose 2 times a day until directed to stop.  **Use as needed 30 mL 0   fluticasone (FLONASE) 50 MCG/ACT nasal spray Place 2 sprays into both nostrils daily. (Patient not taking: Reported on 11/25/2021) 16 g 2   predniSONE (DELTASONE) 20 MG tablet Take 2 tablets by mouth every day for 5 days or until directed to stop 10 tablet 0   No facility-administered medications prior to visit.   Allergies  Allergen  Reactions   Neosporin [Neomycin-Bacitracin Zn-Polymyx]    Thyrogen [Thyrotropin] Other (See Comments)    Thyrogen allergy >> needs pretreatment with steroids      Objective:   Today's Vitals   11/25/21 1252  BP: 116/78  Pulse: 86  Temp: (!) 97.3 F (36.3 C)  TempSrc: Temporal  SpO2: 97%  Weight: 144 lb 12.8 oz (65.7 kg)  Height: '5\' 5"'$  (1.651 m)   Body mass index is 24.1 kg/m.   General: Well developed, well nourished. No acute distress. HEENT: Normocephalic, non-traumatic. Conjunctiva clear. External ears normal. EAC and TMs normal   bilaterally. Nasal mucosa is mildly red and swollen. No significant rhinorrhea. Mucous membranes moist.    There is mild mucous streaking of the posterior oropharynx clear. Good dentition. Psych:  Alert and oriented. Normal mood and affect.  Health Maintenance Due  Topic Date Due   Zoster Vaccines- Shingrix (1 of 2) Never done     Assessment & Plan:   1. Chronic seasonal allergic rhinitis due to pollen I do not feel there would be any real benefit for switching form one daily antihistamine to another. I am fine with her switching back to Zyrtec. I do feel inclusion of a daily nasal steroid spray to be an important part of her regimen. Since the azelastine (an antihistamine) is making her feel worse with thickened drainage, I recommend she stop this. I will substitute ipratropium instead and see if this dries up the drainage better. I do encourage her to continue the nasal saline washes. If not improving, she may benefit from a referral to an allergist and to consider immunotherapy.  - ipratropium (ATROVENT) 0.06 % nasal spray; Place 2 sprays into both nostrils 4 (four) times daily.  Dispense: 15 mL; Refill: 12   Return if symptoms worsen or [name] to improve.   Christy Salter, MD

## 2021-12-29 ENCOUNTER — Other Ambulatory Visit (HOSPITAL_COMMUNITY): Payer: Self-pay

## 2021-12-30 ENCOUNTER — Other Ambulatory Visit (HOSPITAL_COMMUNITY): Payer: Self-pay

## 2022-01-08 ENCOUNTER — Encounter: Payer: Self-pay | Admitting: Internal Medicine

## 2022-01-08 ENCOUNTER — Ambulatory Visit (INDEPENDENT_AMBULATORY_CARE_PROVIDER_SITE_OTHER): Payer: No Typology Code available for payment source | Admitting: Internal Medicine

## 2022-01-08 VITALS — BP 122/76 | HR 89 | Ht 65.0 in | Wt 146.0 lb

## 2022-01-08 DIAGNOSIS — E032 Hypothyroidism due to medicaments and other exogenous substances: Secondary | ICD-10-CM

## 2022-01-08 DIAGNOSIS — C73 Malignant neoplasm of thyroid gland: Secondary | ICD-10-CM | POA: Diagnosis not present

## 2022-01-08 LAB — TSH: TSH: 0.39 u[IU]/mL (ref 0.35–5.50)

## 2022-01-08 LAB — T4, FREE: Free T4: 1.51 ng/dL (ref 0.60–1.60)

## 2022-01-08 NOTE — Patient Instructions (Signed)
Please stop at the lab.  Please continue the current Synthroid dose (125 mcg) daily.  Take the thyroid hormone every day, with water, at least 30 minutes before breakfast, separated by at least 4 hours from: - acid reflux medications - calcium - iron - multivitamins  Please return in 1 year.

## 2022-01-08 NOTE — Progress Notes (Signed)
Patient ID: Christy [name], female   DOB: May 11, 1971, 51 y.o.   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 She was seen by Dr Chalmers Cater in the past.  Last visit with me 1 year ago.  Interim history: At last visit, I saw her after she just had COVID 19- quite severe, she still had fatigue.  This resolved. She has Post nasal drip >> upper neck congestion and occasional mm pain with swallowing. She is planning to see an allergist.   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 Almetta Lovely note, stimulated Tg was 2.9 08/10/2008 Thyrogen stimulated WBS negative 10/15/2009 ultrasound neck with 3 abnormal cervical lymph nodes 11/08/2009 FNA of one of the lymph node that was found to be without fatty hilum: negative for metastases - she had a traumatic experience then and would not want to repeat this 09/26/2010 Thyrogen stimulated WBS negative except uptake in colon 10/07/2012 Thyrogen stimulated WBS negative  ! Patient has a Thyrogen allergy (pain and redness at the inj site) >> needs pretreatment with steroids 10/02/2013: Tg 0.2, ATA <1 10/16/2013: Neck U/S:  Small area of irregular tissue in the left thyroid bed measures roughly 1 cm in length and 0.5 cm in width. This is very ill-defined appearing and may represent scar tissue. Thyroid carcinoma recurrence cannot be completely excluded. Prior whole body nuclear medicine study was negative for any thyroid  activity in the neck. No abnormal tissue is identified in the right thyroid bed. No abnormal lymph nodes are identified. 09/05/2014: CT neck and chest: 1. Negative neck CT status post thyroidectomy. No soft tissue mass or lymphadenopathy corresponding to the right supraclavicular palpable area of concern, or elsewhere in the neck. 2. Negative chest CT.  No acute or metastatic process identified. 03/06/2020: Neck U/S: No suspicious masses  Also: 07/14/2006: Tg 1, ATA 3.6 03/2007: Tg <0.5, Stimulated Tg 2.7 12/2007: Tg <0.5, TSH 0.1 2013: Tg <0.5, ATA <20 09/16/2012: TSH 0.644, Tg 0.1, ATA <1 10/07/2012: Tg 2.0, ATA <1 06/28/2013: TSH 1.1  12/12/2014: TSH 1.45, 12/25/2014: ATA <1, Tg(RIA) <2.0  Most recent labs: Lab Results  Component Value Date   TSH 1.10 01/07/2021   TSH 1.69 07/29/2020   TSH 7.51 (H) 05/06/2020   TSH 3.26 01/02/2020   TSH 1.75 12/09/2018   TSH 0.67 12/10/2017   TSH 1.84 08/30/2017   TSH 1.43 04/23/2017   TSH 0.21 (L) 02/19/2017   TSH 0.22 (L) 12/11/2016   FREET4 1.22 01/07/2021   FREET4 1.24 07/29/2020   FREET4 1.25 05/06/2020   FREET4 1.19 01/02/2020   FREET4 1.31 12/09/2018   FREET4 1.34 12/10/2017   FREET4 1.28 04/23/2017   FREET4 1.53 02/19/2017   FREET4 1.42 12/11/2016   FREET4 1.30 12/12/2015   Lab Results  Component Value Date   THYROGLB 0.1 (L) 01/07/2021   THYROGLB 0.2 (L) 05/06/2020   THYROGLB 0.2 (L) 01/02/2020   THYROGLB 0.1 (L) 12/09/2018   THYROGLB 0.1 (L) 12/10/2017   THYROGLB <0.1 (L) 12/11/2016   THYROGLB 0.2 (L) 12/12/2015   THYROGLB <2.0 12/25/2014  THGAB <1 01/07/2021   THGAB <1 05/06/2020   THGAB <1 01/02/2020   THGAB <1 12/09/2018   THGAB <1 12/10/2017   THGAB <1.0 12/11/2016   THGAB <1.0 12/12/2015   THGAB <1.0 12/25/2014   Christy [name] is on Synthroid 125 d.a.w. mcg daily, taken: - in am - fasting - at least 45-60 min from b'fast - no Ca, Fe, PPIs - stopped MVIs at night - on Biotin - off for 1 mo  Christy [name] denies: -  feeling nodules in neck - hoarseness - dysphagia - choking  She had a low vit D per ObGyn >> started vitamin D.  She is working from home and has time to go for walks frequently.  ROS: + see HPI  I reviewed Christy [name]'s medications, allergies, PMH, social hx, family hx, and changes were documented in the history of present illness. Otherwise, unchanged from my initial visit note.  Past Medical History:  Diagnosis Date   Allergy    seasonal   Thyroid cancer ([place]) 2004   s/p thyroidectomy    History   Social History   Marital Status: Married    Spouse Name: N/A    Number of Children: 2   Occupational History   Systems analyst - [employer] History Main Topics   Smoking status: Never Smoker    Smokeless tobacco: No   Alcohol Use: Occasional: wine, beer   Drug Use: No  Exercise: walk/run - daily   Current Outpatient Medications on File Prior to Visit  Medication Sig Dispense Refill   cetirizine (ZYRTEC) 10 MG tablet Take 1 tablet (10 mg total) by mouth daily. 30 tablet 11   Cholecalciferol (VITAMIN D3 PO) Take 25 mcg by mouth daily.     diphenhydrAMINE (BENADRYL) 25 mg capsule as needed.     fexofenadine (ALLEGRA) 180 MG tablet Take 180 mg by mouth daily.     fluticasone (FLONASE) 50 MCG/ACT nasal spray Place 2 sprays into both nostrils daily. (Patient not taking: Reported on 11/25/2021) 16 g 2   ipratropium (ATROVENT) 0.06 % nasal spray Place 2 sprays into both nostrils 4 (four) times daily. 15 mL 12   Melatonin 3 MG TABS Take by mouth.     NATAZIA 3/2-2/2-3/1 MG tablet TAKE 1 TABLET BY MOUTH DAILY. 84 tablet 4   Omega-3 Fatty Acids (FISH OIL PO) Take by mouth as needed.     SYNTHROID 125 MCG tablet TAKE 1 TABLET BY MOUTH EVERY MORNING BEFORE BREAKFAST 90 tablet 0   No current facility-administered medications on file prior to visit.   Allergies  Allergen Reactions   Neosporin [Neomycin-Bacitracin Zn-Polymyx]    Thyrogen [Thyrotropin] Other (See Comments)     Thyrogen allergy >> needs pretreatment with steroids   FH: H/o heart ds. in father  PE: BP 122/76 (BP Location: Left Arm, Patient Position: Sitting, Cuff Size: Normal)   Pulse 89   Ht '5\' 5"'$  (1.651 m)   Wt 146 lb (66.2 kg)   SpO2 98%   BMI 24.30 kg/m  Wt Readings from Last 3 Encounters:  01/08/22 146 lb (66.2 kg)  11/25/21 144 lb 12.8 oz (65.7 kg)  10/08/21 140 lb (63.5 kg)   Constitutional: normal weight, in NAD Eyes: no exophthalmos ENT: moist mucous membranes, no masses palpated in neck, no cervical lymphadenopathy Cardiovascular: RRR, No MRG Respiratory: CTA B Musculoskeletal: no deformities Skin: moist, warm, no rashes Neurological: no tremor with outstretched hands  ASSESSMENT: 1. Follicular thyroid cancer  2. Postsurgical Hypothyroidism  PLAN:  1. Follicular ThyCa -In remission -Her antithyroglobulin antibodies are negative, but they have been positive in 2008 -Her thyroglobulin levels are fluctuating, barely detectable -at last visit, Tg was 0.1, decreased from 0.2 -her thyroid ultrasound report from 2015 showed: Scar tissue versus thyroid remnant in thyroid bed, but this has been present since her surgery, per records from Dr. Chalmers Cater.  This was the reason for many whole-body scans and PET scans that she had in the past.  She had a chest and neck CT scan in 2016, with contrast, which was negative for metastasis or recurrence. Another neck ultrasound in 03/06/2020 was also negative for any suspicious masses. -No neck compression symptoms -Today's visit, we will repeat her thyroglobulin and ATA -If her thyroglobulin starts rising, we will need to check a PET scan   2. Postsurgical hypothyroidism, on brand-name Synthroid therapy - latest thyroid labs reviewed with Christy [name]. >> normal: Lab Results  Component Value Date   TSH 1.10 01/07/2021  - she continues on LT4 DAW 125 mcg daily - Christy [name] feels good on this dose, without complaints - we discussed about taking the thyroid  hormone every day, with water, >30 minutes before breakfast, separated by >4 hours from acid reflux medications, calcium, iron, multivitamins. Christy [name]. is taking it correctly. - will check thyroid tests today: TSH and fT4 - If labs are abnormal, she will need to return for repeat TFTs in 1.5 months - OTW, I will see her in 1 year  Needs refills.  Component     Latest Ref Rng 01/08/2022  T4,Free(Direct)     0.60 - 1.60 ng/dL 1.51   TSH     0.35 - 5.50 uIU/mL 0.39   Thyroglobulin     ng/mL <0.1 (L)   Comment --   Thyroglobulin Ab     < or = 1 IU/mL <1    Tests are all at goal.  Philemon Kingdom, MD PhD [facility]

## 2022-01-12 LAB — THYROGLOBULIN ANTIBODY: Thyroglobulin Ab: <1 IU/mL (ref <=1)

## 2022-01-12 LAB — THYROGLOBULIN LEVEL: Thyroglobulin: <0.1 ng/mL — ABNORMAL LOW

## 2022-01-13 ENCOUNTER — Other Ambulatory Visit (HOSPITAL_COMMUNITY): Payer: Self-pay

## 2022-01-13 MED ORDER — SYNTHROID 125 MCG PO TABS
ORAL_TABLET | ORAL | 3 refills | Status: DC
  Filled 2022-01-13: qty 90, 90d supply, fill #0

## 2022-02-02 ENCOUNTER — Encounter: Payer: No Typology Code available for payment source | Admitting: Nurse Practitioner

## 2022-02-26 ENCOUNTER — Other Ambulatory Visit (HOSPITAL_BASED_OUTPATIENT_CLINIC_OR_DEPARTMENT_OTHER): Payer: Self-pay

## 2022-02-26 ENCOUNTER — Encounter: Payer: Self-pay | Admitting: Internal Medicine

## 2022-02-27 ENCOUNTER — Other Ambulatory Visit: Payer: Self-pay | Admitting: Internal Medicine

## 2022-02-27 MED ORDER — SYNTHROID 125 MCG PO TABS: ORAL_TABLET | ORAL | 3 refills | Status: DC

## 2022-03-09 ENCOUNTER — Other Ambulatory Visit (HOSPITAL_BASED_OUTPATIENT_CLINIC_OR_DEPARTMENT_OTHER): Payer: Self-pay

## 2022-03-09 MED ORDER — COMIRNATY 30 MCG/0.3ML IM SUSY
PREFILLED_SYRINGE | INTRAMUSCULAR | 0 refills | Status: DC
  Filled 2022-03-09: qty 0.3, 1d supply, fill #0

## 2022-04-05 ENCOUNTER — Other Ambulatory Visit: Payer: Self-pay | Admitting: Nurse Practitioner

## 2022-04-05 DIAGNOSIS — J301 Allergic rhinitis due to pollen: Secondary | ICD-10-CM

## 2022-04-06 ENCOUNTER — Other Ambulatory Visit (HOSPITAL_COMMUNITY): Payer: Self-pay

## 2022-04-06 MED ORDER — FLUTICASONE PROPIONATE 50 MCG/ACT NA SUSP
2.0000 | Freq: Every day | NASAL | 2 refills | Status: DC
  Filled 2022-04-06: qty 16, 30d supply, fill #0
  Filled 2022-05-04: qty 16, 30d supply, fill #1
  Filled 2022-08-11: qty 16, 30d supply, fill #2

## 2022-04-07 ENCOUNTER — Other Ambulatory Visit (HOSPITAL_COMMUNITY): Payer: Self-pay

## 2022-05-04 ENCOUNTER — Other Ambulatory Visit: Payer: Self-pay

## 2022-05-05 ENCOUNTER — Other Ambulatory Visit: Payer: Self-pay

## 2022-05-27 ENCOUNTER — Ambulatory Visit (INDEPENDENT_AMBULATORY_CARE_PROVIDER_SITE_OTHER): Payer: 59 | Admitting: Family Medicine

## 2022-05-27 ENCOUNTER — Other Ambulatory Visit (HOSPITAL_BASED_OUTPATIENT_CLINIC_OR_DEPARTMENT_OTHER): Payer: Self-pay

## 2022-05-27 ENCOUNTER — Encounter: Payer: Self-pay | Admitting: Family Medicine

## 2022-05-27 VITALS — BP 120/78 | HR 75 | Temp 97.7°F | Wt 147.8 lb

## 2022-05-27 DIAGNOSIS — M25511 Pain in right shoulder: Secondary | ICD-10-CM

## 2022-05-27 MED ORDER — MELOXICAM 15 MG PO TABS
15.0000 mg | ORAL_TABLET | Freq: Every day | ORAL | 0 refills | Status: DC
  Filled 2022-05-27: qty 30, 30d supply, fill #0

## 2022-05-27 MED ORDER — DICLOFENAC SODIUM 1 % EX GEL
4.0000 g | Freq: Four times a day (QID) | CUTANEOUS | 3 refills | Status: DC | PRN
  Filled 2022-05-27: qty 100, 12d supply, fill #0

## 2022-05-27 NOTE — Progress Notes (Signed)
Patient: [52 year old female] Encounter Type: Outpatient visit for shoulder pain  Subjective:  The patient presents with chronic right shoulder pain that has been problematic for approximately 11 years, exacerbated post-sleeping on the same shoulder due to her husband's injury. She reports a recent increase in pain beginning in October after receiving a COVID vaccine. The initial soreness at the injection site resolved, but two weeks later, a deep, joint-related pain began. Currently, she experiences pain with certain movements and when applying pressure. It is noted to radiate down to the collarbone and occasionally towards the base of her neck on severe days.  Objective:  Vital Signs: Vitals:   05/27/22 1026  BP: 120/78  Pulse: 75  Temp: 97.7 F (36.5 C)  SpO2: 98%   General Appearance: Patient is alert and oriented, expressing concerns about shoulder pain.  Musculoskeletal Exam:  Right shoulder examination reveals pain with abduction and certain movements, especially lifting to the side. Pain is localized on the external lateral side of the arm, radiating down towards the collarbone. No signs of acute trauma, swelling, or dislocation are evident. Strength appears intact, with painful movements rather than weakness.  Assessment/Plan:  Shoulder Pain, Right - Patient complains of progressive shoulder pain, which worsens with certain movements and pressure. To investigate further, consider ordering radiographic imaging, with an X-ray as the initial approach.  Management Strategies - The patient has attempted various over-the-counter treatments, including NSAIDs, ice application, and TENS therapy with temporary relief. A prescribed course of an NSAID, such as meloxicam, is introduced for more consistent pain control. Voltaren gel may provide more localized relief.  Referral - Due to the progressive and persistent nature of the pain and the patient's already extensive self-management  attempts, refer the patient to sports medicine for a specialized assessment, potential ultrasound imaging, and consideration of therapy or injections.  Follow-up - Advise the patient to follow up after the sports medicine assessment or sooner if symptoms escalate significantly. Provide guidance for activities to avoid exacerbating the pain and discuss the benefits of maintaining an active range of motion without over-stressing the joint.  Prescriptions Given:  Meloxicam 15 milligrams once a day PRN Voltaren gel for topical application on sore area  Labs/Imaging:  X-ray of the right shoulder was offered, but the patient chose not to proceed through shared decision making. Referrals:  Sports Medicine for further evaluation and management. Patient education was provided on conservative measures and potential ergonomic assessments for work-related activities. Advised to maintain current medications for systemic conditions as prescribed.

## 2022-06-02 ENCOUNTER — Other Ambulatory Visit (HOSPITAL_COMMUNITY): Payer: Self-pay

## 2022-06-03 ENCOUNTER — Ambulatory Visit (INDEPENDENT_AMBULATORY_CARE_PROVIDER_SITE_OTHER): Payer: 59 | Admitting: Family Medicine

## 2022-06-03 VITALS — BP 126/82 | Ht 65.0 in | Wt 145.0 lb

## 2022-06-03 DIAGNOSIS — G8929 Other chronic pain: Secondary | ICD-10-CM | POA: Diagnosis not present

## 2022-06-03 DIAGNOSIS — M25511 Pain in right shoulder: Secondary | ICD-10-CM

## 2022-06-03 NOTE — Patient Instructions (Signed)
You have rotator cuff impingement Try to avoid painful activities (overhead activities, lifting with extended arm) as much as possible. Meloxicam '15mg'$  daily with food for pain and inflammation - take for about 7 days then as needed.   Can take tylenol in addition to this. Subacromial injection may be beneficial to help with pain and to decrease inflammation if you're struggling. Consider physical therapy with transition to home exercise program. Do home exercise program with theraband and scapular stabilization exercises daily 3 sets of 10 once a day. If not improving at follow-up we will consider ultrasound, injection, physical therapy, and/or nitro patches. Follow up with me in 1 month to 6 weeks.

## 2022-06-03 NOTE — Progress Notes (Signed)
PCP: Flossie Buffy, NP  Subjective:   HPI: Patient is a 52 y.o. female here for right shoulder pain.  Patient reports she's had lateral right shoulder pain for several years. No injury or trauma. Pain worse since October. Especially noticed it during holidays when doing a lot of cooking, reaching, lifting. Pain is lateral. No swelling, bruising. Just started on meloxicam and voltaren gel. No noticed much difference with voltaren gel.  Past Medical History:  Diagnosis Date   Allergy    seasonal   Thyroid cancer ([place]) 2004   s/p thyroidectomy     Current Outpatient Medications on File Prior to Visit  Medication Sig Dispense Refill   cetirizine (ZYRTEC) 10 MG tablet Take 1 tablet (10 mg total) by mouth daily. 30 tablet 11   Cholecalciferol (VITAMIN D3 PO) Take 25 mcg by mouth daily.     COVID-19 mRNA vaccine 2023-2024 (COMIRNATY) syringe Inject into the muscle. 0.3 mL 0   diclofenac Sodium (VOLTAREN) 1 % GEL Apply 4 g topically 4 (four) times daily as needed. 100 g 3   diphenhydrAMINE (BENADRYL) 25 mg capsule as needed.     fexofenadine (ALLEGRA) 180 MG tablet Take 180 mg by mouth daily. (Patient not taking: Reported on 05/27/2022)     fluticasone (FLONASE) 50 MCG/ACT nasal spray Place 2 sprays into both nostrils daily. 16 g 2   ipratropium (ATROVENT) 0.06 % nasal spray Place 2 sprays into both nostrils 4 times daily. (Patient not taking: Reported on 05/27/2022) 15 mL 12   Melatonin 3 MG TABS Take by mouth. (Patient not taking: Reported on 05/27/2022)     meloxicam (MOBIC) 15 MG tablet Take 1 tablet (15 mg total) by mouth daily. 30 tablet 0   NATAZIA 3/2-2/2-3/1 MG tablet TAKE 1 TABLET BY MOUTH DAILY. 84 tablet 4   Omega-3 Fatty Acids (FISH OIL PO) Take by mouth as needed.     SYNTHROID 125 MCG tablet TAKE 1 TABLET BY MOUTH EVERY MORNING BEFORE BREAKFAST 90 tablet 3   No current facility-administered medications on file prior to visit.    Past Surgical History:  Procedure  Laterality Date   CYST EXCISION Right    "on rib cage"   THYROIDECTOMY  05/18/2002   TONSILLECTOMY     age 53    Allergies  Allergen Reactions   Neosporin [Neomycin-Bacitracin Zn-Polymyx]    Thyrogen [Thyrotropin] Other (See Comments)    Thyrogen allergy >> needs pretreatment with steroids    BP 126/82   Ht '5\' 5"'$  (1.651 m)   Wt 145 lb (65.8 kg)   BMI 24.13 kg/m       No data to display              No data to display              Objective:  Physical Exam:  Gen: NAD, comfortable in exam room  Right shoulder: No swelling, ecchymoses.  No gross deformity. No TTP AC joint, biceps tendon. FROM with painful arc. Negative Hawkins, positive Neers. Negative Yergasons. Strength 5/5 with empty can and resisted internal/external rotation.  Pain empty can. Negative apprehension. NV intact distally.   Assessment & Plan:  1. Right shoulder pain - 2/2 impingement.  Exam reassuring.  Start home exercise program which was reviewed today.  Meloxicam.  Consider physical therapy, nitro patches, injection if not improving.  F/u in 1 month to 6 weeks.

## 2022-06-30 ENCOUNTER — Other Ambulatory Visit: Payer: Self-pay

## 2022-07-01 ENCOUNTER — Ambulatory Visit (INDEPENDENT_AMBULATORY_CARE_PROVIDER_SITE_OTHER): Payer: 59 | Admitting: Nurse Practitioner

## 2022-07-01 ENCOUNTER — Encounter: Payer: Self-pay | Admitting: Nurse Practitioner

## 2022-07-01 ENCOUNTER — Other Ambulatory Visit (HOSPITAL_BASED_OUTPATIENT_CLINIC_OR_DEPARTMENT_OTHER): Payer: Self-pay

## 2022-07-01 VITALS — BP 116/82 | HR 80 | Temp 98.7°F | Resp 16 | Ht 65.0 in | Wt 142.8 lb

## 2022-07-01 DIAGNOSIS — Z136 Encounter for screening for cardiovascular disorders: Secondary | ICD-10-CM

## 2022-07-01 DIAGNOSIS — Z23 Encounter for immunization: Secondary | ICD-10-CM

## 2022-07-01 DIAGNOSIS — D229 Melanocytic nevi, unspecified: Secondary | ICD-10-CM | POA: Diagnosis not present

## 2022-07-01 DIAGNOSIS — Z0001 Encounter for general adult medical examination with abnormal findings: Secondary | ICD-10-CM

## 2022-07-01 DIAGNOSIS — Z13220 Encounter for screening for lipoid disorders: Secondary | ICD-10-CM | POA: Diagnosis not present

## 2022-07-01 DIAGNOSIS — E032 Hypothyroidism due to medicaments and other exogenous substances: Secondary | ICD-10-CM

## 2022-07-01 DIAGNOSIS — J324 Chronic pansinusitis: Secondary | ICD-10-CM

## 2022-07-01 DIAGNOSIS — Z0000 Encounter for general adult medical examination without abnormal findings: Secondary | ICD-10-CM

## 2022-07-01 LAB — COMPREHENSIVE METABOLIC PANEL
ALT: 9 U/L (ref 0–35)
AST: 15 U/L (ref 0–37)
Albumin: 4.3 g/dL (ref 3.5–5.2)
Alkaline Phosphatase: 49 U/L (ref 39–117)
BUN: 13 mg/dL (ref 6–23)
CO2: 27 mEq/L (ref 19–32)
Calcium: 9.5 mg/dL (ref 8.4–10.5)
Chloride: 105 mEq/L (ref 96–112)
Creatinine, Ser: 0.68 mg/dL (ref 0.40–1.20)
GFR: 101.17 mL/min (ref >60.00)
Glucose, Bld: 87 mg/dL (ref 70–99)
Potassium: 3.8 mEq/L (ref 3.5–5.1)
Sodium: 137 mEq/L (ref 135–145)
Total Bilirubin: 0.5 mg/dL (ref 0.2–1.2)
Total Protein: 7 g/dL (ref 6.0–8.3)

## 2022-07-01 LAB — LIPID PANEL
Cholesterol: 187 mg/dL (ref 0–200)
HDL: 75.1 mg/dL (ref >39.00)
LDL Cholesterol: 91 mg/dL (ref 0–99)
NonHDL: 111.72
Total CHOL/HDL Ratio: 2
Triglycerides: 105 mg/dL (ref 0.0–149.0)
VLDL: 21 mg/dL (ref 0.0–40.0)

## 2022-07-01 MED ORDER — AZITHROMYCIN 250 MG PO TABS
250.0000 mg | ORAL_TABLET | Freq: Every day | ORAL | 0 refills | Status: DC
  Filled 2022-07-01: qty 6, 5d supply, fill #0

## 2022-07-01 NOTE — Assessment & Plan Note (Signed)
Nasal congestion since 10/2021, constant Use of oral prednisone, flonase, atrovent, allegra, zyrtec and benadryl with minimal improvement. Atrovent caused nosebleed. Unable to tolerate azelastine Minimal releif with oral prednisone Denies any GERD symptoms Associated with snoring when lays on left side. Changes air filter at home and use of cool humidifier in winter season, lives with a dog for several years.  Hold flonase and atrovent x 1week, then resume only flonase. Use saline nasal spary prn Start azithromycin Consider referral to allergist if no improvement

## 2022-07-01 NOTE — Progress Notes (Signed)
Stable Follow instructions as discussed during office visit.

## 2022-07-01 NOTE — Assessment & Plan Note (Signed)
Atypical lesion on right upper back and left lower back. Agreed to dermatology referral

## 2022-07-01 NOTE — Progress Notes (Signed)
Complete physical exam  Patient: Christy [name]   DOB: 01-Jan-1971   52 y.o. Female  MRN: [id number] Visit Date: 07/01/2022  Subjective:    Chief Complaint  Patient presents with   Annual Exam    Fasting  Shingles and Tdap    Christy [name] is a 52 y.o. female who presents today for a complete physical exam. She reports consuming a general diet.  Rowing machine and stength training 5x/week  She generally feels well. She reports sleeping well. She does have additional problems to discuss today.  Vision:No Dental:Yes STD Screen:No  BP Readings from Last 3 Encounters:  07/01/22 116/82  06/03/22 126/82  05/27/22 120/78   Wt Readings from Last 3 Encounters:  07/01/22 142 lb 12.8 oz (64.8 kg)  06/03/22 145 lb (65.8 kg)  05/27/22 147 lb 12.8 oz (67 kg)   Most recent fall risk assessment:    07/01/2022    9:26 AM  Fall Risk   Falls in the past year? 0  Number falls in past yr: 0  Injury with Fall? 0  Risk for fall due to : No Fall Risks  Follow up Falls evaluation completed   Depression screen:Yes - No Depression  Most recent depression screenings:    07/01/2022    9:26 AM 11/25/2021    1:03 PM  PHQ 2/9 Scores  PHQ - 2 Score 0 0   Sinus Problem This is a chronic problem. The current episode started more than 1 month ago. The problem has been waxing and waning since onset. There has been no fever. The pain is moderate. Associated symptoms include congestion and sinus pressure. Pertinent negatives include no chills, coughing, diaphoresis, ear pain, headaches, hoarse voice, neck pain, shortness of breath, sneezing, sore throat or swollen glands. (Snoring at hs) Treatments tried: flonase, antihistamine, atrovent, azelastine oral prednisone. The treatment provided no relief.    Chronic pansinusitis Nasal congestion since 10/2021, constant Use of oral prednisone, flonase, atrovent, allegra, zyrtec and benadryl with minimal improvement. Atrovent caused nosebleed. Unable to  tolerate azelastine Minimal releif with oral prednisone Denies any GERD symptoms Associated with snoring when lays on left side. Changes air filter at home and use of cool humidifier in winter season, lives with a dog for several years.  Hold flonase and atrovent x 1week, then resume only flonase. Use saline nasal spary prn Start azithromycin Consider referral to allergist if no improvement  Skin mole Atypical lesion on right upper back and left lower back. Agreed to dermatology referral  Past Medical History:  Diagnosis Date   Allergy    seasonal   Thyroid cancer ([place]) 2004   s/p thyroidectomy    Past Surgical History:  Procedure Laterality Date   CYST EXCISION Right    "on rib cage"   THYROIDECTOMY  05/18/2002   TONSILLECTOMY     age 54   Social History   Socioeconomic History   Marital status: Married    Spouse name: Not on file   Number of children: 0   Years of education: Not on file   Highest education level: Not on file  Occupational History   Occupation: IT    Employer: [employer]  Tobacco Use   Smoking status: Never   Smokeless tobacco: Never  Vaping Use   Vaping Use: Never used  Substance and Sexual Activity   Alcohol use: Yes    Alcohol/week: 3.0 standard drinks of alcohol    Types: 2 Cans of beer, 1 Standard drinks or  equivalent per week    Comment: Up to 1-2 drinks a week.   Drug use: Never   Sexual activity: Yes    Birth control/protection: Pill  Other Topics Concern   Not on file  Social History Narrative   Not on file   Social Determinants of Health   Financial Resource Strain: Not on file  Food Insecurity: Not on file  Transportation Needs: Not on file  Physical Activity: Sufficiently Active (06/06/2021)   Exercise Vital Sign    Days of Exercise per Week: 7 days    Minutes of Exercise per Session: 50 min  Stress: Not on file  Social Connections: Not on file  Intimate Partner Violence: Not on file   Family Status  Relation Name  Status   Mother Mardelle Matte Alive   Father [employer]   Sister  Caldwell  (Not Specified)   MGM  Deceased   MGF I.S. Deceased   PGM  Deceased   PGF Frutoso Chase Deceased   Pat Aunt Carloyn Manner (Not Specified)   Sister Hope (Not Specified)   Neg Hx  (Not Specified)   Family History  Problem Relation Age of Onset   Arthritis Mother    Colon polyps Mother    Depression Mother    Heart disease Father    Colon polyps Father    Arthritis Father    Diverticulitis Sister    Colon cancer Paternal Aunt    Dementia Maternal Grandmother    Heart disease Maternal Grandfather    Heart disease Paternal Grandfather    Depression Paternal Aunt    Obesity Paternal Aunt    Depression Sister    Crohn's disease Neg Hx    Esophageal cancer Neg Hx    Rectal cancer Neg Hx    Stomach cancer Neg Hx    Allergies  Allergen Reactions   Neosporin [Neomycin-Bacitracin Zn-Polymyx]    Thyrogen [Thyrotropin] Other (See Comments)    Thyrogen allergy >> needs pretreatment with steroids    Patient Care Team: Zabria Liss, Charlene Brooke, NP as PCP - General (Internal Medicine)   Medications: Outpatient Medications Prior to Visit  Medication Sig   cetirizine (ZYRTEC) 10 MG tablet Take 1 tablet (10 mg total) by mouth daily.   Cholecalciferol (VITAMIN D3 PO) Take 25 mcg by mouth daily.   COVID-19 mRNA vaccine 2023-2024 (COMIRNATY) syringe Inject into the muscle.   diclofenac Sodium (VOLTAREN) 1 % GEL Apply 4 g topically 4 (four) times daily as needed.   diphenhydrAMINE (BENADRYL) 25 mg capsule as needed.   fluticasone (FLONASE) 50 MCG/ACT nasal spray Place 2 sprays into both nostrils daily.   ipratropium (ATROVENT) 0.06 % nasal spray Place 2 sprays into both nostrils 4 times daily.   Melatonin 3 MG TABS Take by mouth.   meloxicam (MOBIC) 15 MG tablet Take 1 tablet (15 mg total) by mouth daily.   NATAZIA 3/2-2/2-3/1 MG tablet TAKE 1 TABLET BY MOUTH DAILY.   Omega-3 Fatty Acids (FISH OIL  PO) Take by mouth as needed.   SYNTHROID 125 MCG tablet TAKE 1 TABLET BY MOUTH EVERY MORNING BEFORE BREAKFAST   fexofenadine (ALLEGRA) 180 MG tablet Take 180 mg by mouth daily. (Patient not taking: Reported on 05/27/2022)   No facility-administered medications prior to visit.    Review of Systems  Constitutional:  Negative for activity change, appetite change, chills, diaphoresis and unexpected weight change.  HENT:  Positive for congestion, nosebleeds, postnasal drip, rhinorrhea and  sinus pressure. Negative for ear discharge, ear pain, facial swelling, hearing loss, hoarse voice, sneezing, sore throat and voice change.   Respiratory: Negative.  Negative for cough and shortness of breath.   Cardiovascular: Negative.   Gastrointestinal: Negative.   Endocrine: Negative for cold intolerance and heat intolerance.  Genitourinary: Negative.   Musculoskeletal: Negative.  Negative for neck pain.  Skin: Negative.   Neurological: Negative.  Negative for headaches.  Hematological: Negative.   Psychiatric/Behavioral:  Negative for behavioral problems, decreased concentration, dysphoric mood, hallucinations, self-injury, sleep disturbance and suicidal ideas. The patient is not nervous/anxious.        Objective:  BP 116/82 (BP Location: Left Arm, Patient Position: Sitting, Cuff Size: Normal)   Pulse 80   Temp 98.7 F (37.1 C) (Temporal)   Resp 16   Ht 5' 5"$  (1.651 m)   Wt 142 lb 12.8 oz (64.8 kg)   SpO2 100%   BMI 23.76 kg/m     Physical Exam Vitals and nursing note reviewed.  Constitutional:      General: She is not in acute distress. HENT:     Head:     Jaw: There is normal jaw occlusion.     Salivary Glands: Right salivary gland is not diffusely enlarged or tender. Left salivary gland is not diffusely enlarged or tender.     Right Ear: Tympanic membrane, ear canal and external ear normal.     Left Ear: Tympanic membrane, ear canal and external ear normal.     Nose: Mucosal edema,  congestion and rhinorrhea present.     Right Nostril: No foreign body, epistaxis or occlusion.     Left Nostril: No foreign body, epistaxis or occlusion.     Right Turbinates: Enlarged and swollen.     Left Turbinates: Enlarged and swollen.     Mouth/Throat:     Mouth: Mucous membranes are moist.     Pharynx: Oropharynx is clear. Uvula midline. No oropharyngeal exudate or posterior oropharyngeal erythema.     Tonsils: No tonsillar exudate.  Eyes:     Extraocular Movements: Extraocular movements intact.     Conjunctiva/sclera: Conjunctivae normal.     Pupils: Pupils are equal, round, and reactive to light.  Neck:     Thyroid: No thyroid mass, thyromegaly or thyroid tenderness.  Cardiovascular:     Rate and Rhythm: Normal rate and regular rhythm.     Pulses: Normal pulses.     Heart sounds: Normal heart sounds.  Pulmonary:     Effort: Pulmonary effort is normal.     Breath sounds: Normal breath sounds.  Abdominal:     General: Bowel sounds are normal.     Palpations: Abdomen is soft.  Genitourinary:    Comments: Deferred breast and pelvic exam to GYN Musculoskeletal:        General: Normal range of motion.     Cervical back: Normal range of motion and neck supple.     Right lower leg: No edema.     Left lower leg: No edema.  Lymphadenopathy:     Cervical: No cervical adenopathy.  Skin:    General: Skin is warm and dry.  Neurological:     Mental Status: She is alert and oriented to person, place, and time.     Cranial Nerves: No cranial nerve deficit.  Psychiatric:        Mood and Affect: Mood normal.        Behavior: Behavior normal.        Thought  Content: Thought content normal.     Results for orders placed or performed in visit on 07/01/22  Comprehensive metabolic panel  Result Value Ref Range   Sodium 137 135 - 145 mEq/L   Potassium 3.8 3.5 - 5.1 mEq/L   Chloride 105 96 - 112 mEq/L   CO2 27 19 - 32 mEq/L   Glucose, Bld 87 70 - 99 mg/dL   BUN 13 6 - 23 mg/dL    Creatinine, Ser 0.68 0.40 - 1.20 mg/dL   Total Bilirubin 0.5 0.2 - 1.2 mg/dL   Alkaline Phosphatase 49 39 - 117 U/L   AST 15 0 - 37 U/L   ALT 9 0 - 35 U/L   Total Protein 7.0 6.0 - 8.3 g/dL   Albumin 4.3 3.5 - 5.2 g/dL   GFR 101.17 >60.00 mL/min   Calcium 9.5 8.4 - 10.5 mg/dL  Lipid panel  Result Value Ref Range   Cholesterol 187 0 - 200 mg/dL   Triglycerides 105.0 0.0 - 149.0 mg/dL   HDL 75.10 >39.00 mg/dL   VLDL 21.0 0.0 - 40.0 mg/dL   LDL Cholesterol 91 0 - 99 mg/dL   Total CHOL/HDL Ratio 2    NonHDL 111.72       Assessment & Plan:    Routine Health Maintenance and Physical Exam  Immunization History  Administered Date(s) Administered   COVID-19, mRNA, vaccine(Comirnaty)12 years and older 03/09/2022   Influenza Whole 02/15/2013   Influenza,inj,Quad PF,6+ Mos 01/17/2018, 02/26/2022   Influenza-Unspecified 02/14/2014, 01/31/2015, 02/18/2017, 02/25/2021   PFIZER(Purple Top)SARS-COV-2 Vaccination 05/27/2019, 06/16/2019, 03/08/2020   Pfizer Covid-19 Vaccine Bivalent Booster 59yr & up 03/03/2021   Td 07/01/2022   Tdap 06/17/2012   Zoster Recombinat (Shingrix) 07/01/2022    Health Maintenance  Topic Date Due   HIV Screening  Never done   Hepatitis C Screening  Never done   COVID-19 Vaccine (6 - 2023-24 season) 05/04/2022   PAP SMEAR-Modifier  05/08/2022   Zoster Vaccines- Shingrix (2 of 2) 08/26/2022   MAMMOGRAM  10/18/2023   COLONOSCOPY (Pts 45-451yrInsurance coverage will need to be confirmed)  10/09/2026   DTaP/Tdap/Td (3 - Td or Tdap) 07/01/2032   INFLUENZA VACCINE  Completed   HPV VACCINES  Aged Out    Discussed health benefits of physical activity, and encouraged her to engage in regular exercise appropriate for her age and condition.  Problem List Items Addressed This Visit       Respiratory   Chronic pansinusitis    Nasal congestion since 10/2021, constant Use of oral prednisone, flonase, atrovent, allegra, zyrtec and benadryl with minimal  improvement. Atrovent caused nosebleed. Unable to tolerate azelastine Minimal releif with oral prednisone Denies any GERD symptoms Associated with snoring when lays on left side. Changes air filter at home and use of cool humidifier in winter season, lives with a dog for several years.  Hold flonase and atrovent x 1week, then resume only flonase. Use saline nasal spary prn Start azithromycin Consider referral to allergist if no improvement      Relevant Medications   azithromycin (ZITHROMAX Z-PAK) 250 MG tablet     Musculoskeletal and Integument   Skin mole    Atypical lesion on right upper back and left lower back. Agreed to dermatology referral      Relevant Orders   Ambulatory referral to Dermatology   Other Visit Diagnoses     Encounter for preventative adult health care exam with abnormal findings    -  Primary   Relevant  Orders   Comprehensive metabolic panel (Completed)   Lipid panel (Completed)   Encounter for lipid screening for cardiovascular disease       Relevant Orders   Lipid panel (Completed)   Immunization due       Relevant Orders   Td : Tetanus/diphtheria >7yo Preservative  free (Completed)   Zoster Recombinant (Shingrix ) (Completed)      Return in about 1 year (around 07/02/2023) for CPE (fasting).     Wilfred Lacy, NP

## 2022-07-01 NOTE — Patient Instructions (Addendum)
Go to lab Continue Heart healthy diet and daily exercise. Schedule nurse visit for 2nd shingrix vaccine in 2-74month Increase daily oral hydration to 64oz May also use metamucil or benefiber as needed for constipation Start azithromycin, stop flonase and atroven nasal spray. Ok to use saline nasal mist needed. If no improvement in 2weeks, call office for referral to allergist  Preventive Care [id number] Old, Female Preventive care refers to lifestyle choices and visits with your health care provider that can promote health and wellness. Preventive care visits are also called wellness exams. What can I expect for my preventive care visit? Counseling Your health care provider may ask you questions about your: Medical history, including: Past medical problems. Family medical history. Pregnancy history. Current health, including: Menstrual cycle. Method of birth control. Emotional well-being. Home life and relationship well-being. Sexual activity and sexual health. Lifestyle, including: Alcohol, nicotine or tobacco, and drug use. Access to firearms. Diet, exercise, and sleep habits. Work and work [employer] Sun[employer] use. Safety issues such as seatbelt and bike helmet use. Physical exam Your health care provider will check your: Height and weight. These may be used to calculate your BMI (body mass index). BMI is a measurement that tells if you are at a healthy weight. Waist circumference. This measures the distance around your waistline. This measurement also tells if you are at a healthy weight and may help predict your risk of certain diseases, such as type 2 diabetes and high blood pressure. Heart rate and blood pressure. Body temperature. Skin for abnormal spots. What immunizations do I need?  Vaccines are usually given at various ages, according to a schedule. Your health care provider will recommend vaccines for you based on your age, medical history, and lifestyle or  other factors, such as travel or where you work. What tests do I need? Screening Your health care provider may recommend screening tests for certain conditions. This may include: Lipid and cholesterol levels. Diabetes screening. This is done by checking your blood sugar (glucose) after you have not eaten for a while (fasting). Pelvic exam and Pap test. Hepatitis B test. Hepatitis C test. HIV (human immunodeficiency virus) test. STI (sexually transmitted infection) testing, if you are at risk. Lung cancer screening. Colorectal cancer screening. Mammogram. Talk with your health care provider about when you should start having regular mammograms. This may depend on whether you have a family history of breast cancer. BRCA-related cancer screening. This may be done if you have a family history of breast, ovarian, tubal, or peritoneal cancers. Bone density scan. This is done to screen for osteoporosis. Talk with your health care provider about your test results, treatment options, and if necessary, the need for more tests. Follow these instructions at home: Eating and drinking  Eat a diet that includes fresh fruits and vegetables, whole grains, lean protein, and low-fat dairy products. Take vitamin and mineral supplements as recommended by your health care provider. Do not drink alcohol if: Your health care provider tells you not to drink. You are pregnant, may be pregnant, or are planning to become pregnant. If you drink alcohol: Limit how much you have to 0-1 drink a day. Know how much alcohol is in your drink. In the [place] one drink equals one 12 oz bottle of beer (355 mL), one 5 oz glass of wine (148 mL), or one 1 oz glass of hard liquor (44 mL). Lifestyle Brush your teeth every morning and night with fluoride toothpaste. Floss one time each day. Exercise for at  least 30 minutes 5 or more days each week. Do not use any products that contain nicotine or tobacco. These products include  cigarettes, chewing tobacco, and vaping devices, such as e-cigarettes. If you need help quitting, ask your health care provider. Do not use drugs. If you are sexually active, practice safe sex. Use a condom or other form of protection to prevent STIs. If you do not wish to become pregnant, use a form of birth control. If you plan to become pregnant, see your health care provider for a prepregnancy visit. Take aspirin only as told by your health care provider. Make sure that you understand how much to take and what form to take. Work with your health care provider to find out whether it is safe and beneficial for you to take aspirin daily. Find healthy ways to manage stress, such as: Meditation, yoga, or listening to music. Journaling. Talking to a trusted person. Spending time with friends and family. Minimize exposure to UV radiation to reduce your risk of skin cancer. Safety Always wear your seat belt while driving or riding in a vehicle. Do not drive: If you have been drinking alcohol. Do not ride with someone who has been drinking. When you are tired or distracted. While texting. If you have been using any mind-altering substances or drugs. Wear a helmet and other protective equipment during sports activities. If you have firearms in your house, make sure you follow all gun safety procedures. Seek help if you have been physically or sexually abused. What's next? Visit your health care provider once a year for an annual wellness visit. Ask your health care provider how often you should have your eyes and teeth checked. Stay up to date on all vaccines. This information is not intended to replace advice given to you by your health care provider. Make sure you discuss any questions you have with your health care provider. Document Revised: 10/30/2020 Document Reviewed: 10/30/2020 Elsevier Patient Education  [employer].

## 2022-07-03 ENCOUNTER — Other Ambulatory Visit (HOSPITAL_COMMUNITY)
Admission: RE | Admit: 2022-07-03 | Discharge: 2022-07-03 | Disposition: A | Discharge status: Home / Self Care | Payer: 59 | Source: Ambulatory Visit | Attending: Medical | Admitting: Medical

## 2022-07-03 ENCOUNTER — Ambulatory Visit (INDEPENDENT_AMBULATORY_CARE_PROVIDER_SITE_OTHER): Payer: 59 | Admitting: Medical

## 2022-07-03 ENCOUNTER — Encounter (HOSPITAL_BASED_OUTPATIENT_CLINIC_OR_DEPARTMENT_OTHER): Payer: Self-pay | Admitting: Medical

## 2022-07-03 VITALS — BP 121/92 | HR 79 | Ht 65.0 in | Wt 144.0 lb

## 2022-07-03 DIAGNOSIS — Z01419 Encounter for gynecological examination (general) (routine) without abnormal findings: Secondary | ICD-10-CM | POA: Diagnosis not present

## 2022-07-03 NOTE — Progress Notes (Signed)
History:  Ms. DEKIYA OBEIRNE is a 52 y.o. G2P0020 who presents to clinic today for annual exam with pap smear. Last pap smear was 2020 and normal per patient. Last pap smear in Epic was 2017 and normal. Patient reports normal mammogram 10/2021. She continues to have very light spotting most months only. She denies abnormal discharge, pain, UTI symptoms. She has had mild constipation that she addressed with her PCP and is working on increased hydration and dietary changes. She is having light spotting today.   The following portions of the patient's history were reviewed and updated as appropriate: allergies, current medications, family history, past medical history, social history, past surgical history and problem list.  Review of Systems:  Review of Systems  Constitutional:  Negative for fever and malaise/fatigue.  Gastrointestinal:  Negative for abdominal pain, constipation, diarrhea, nausea and vomiting.  Genitourinary:  Negative for dysuria, frequency and urgency.       Neg - discharge, pelvic pain + Vaginal bleeding      Objective:  Physical Exam BP (!) 121/92   Pulse 79   Ht 5' 5"$  (1.651 m)   Wt 144 lb (65.3 kg)   BMI 23.96 kg/m  Physical Exam Vitals and nursing note reviewed. Exam conducted with a chaperone present.  Constitutional:      General: She is not in acute distress.    Appearance: Normal appearance. She is well-developed and normal weight.  HENT:     Head: Normocephalic and atraumatic.  Neck:     Thyroid: No thyromegaly.  Cardiovascular:     Rate and Rhythm: Normal rate and regular rhythm.     Heart sounds: No murmur heard. Pulmonary:     Effort: Pulmonary effort is normal. No respiratory distress.     Breath sounds: Normal breath sounds. No wheezing.  Abdominal:     General: Abdomen is flat. Bowel sounds are normal. There is no distension.     Palpations: Abdomen is soft. There is no mass.     Tenderness: There is no abdominal tenderness. There is no  guarding or rebound.  Genitourinary:    General: Normal vulva.     Vagina: Bleeding (scant) present. No vaginal discharge, erythema or tenderness.     Cervix: Discharge (mucous) and cervical bleeding (scant following pap) present. No cervical motion tenderness, friability, lesion or erythema.     Uterus: Not enlarged and not tender.      Adnexa:        Right: No mass or tenderness.         Left: No mass or tenderness.    Musculoskeletal:     Cervical back: Neck supple.  Skin:    General: Skin is warm and dry.     Findings: No erythema.  Neurological:     Mental Status: She is alert and oriented to person, place, and time.  Psychiatric:        Mood and Affect: Mood normal.     Health Maintenance Due  Topic Date Due   COVID-19 Vaccine (6 - 2023-24 season) 05/04/2022   PAP SMEAR-Modifier  05/08/2022    Labs, imaging and previous visits in Epic and Care Everywhere reviewed  Assessment & Plan:  1. Encntr for gyn exam (general) (routine) w/o abn findings - Cytology - PAP( [facility] [place]) - Declined STD testing  - Follow-up in 1 year for annual exam, also discussed ok to have physicals with PCP until next pap is due. If pap is normal today, next pap  due 3-5 years    Danielle Rankin 07/03/2022 11:50 AM

## 2022-07-08 ENCOUNTER — Ambulatory Visit: Payer: 59 | Admitting: Family Medicine

## 2022-07-08 LAB — CYTOLOGY - PAP
Comment: NEGATIVE
Diagnosis: UNDETERMINED — AB
High risk HPV: NEGATIVE

## 2022-07-30 ENCOUNTER — Encounter: Payer: Self-pay | Admitting: Nurse Practitioner

## 2022-07-30 DIAGNOSIS — J301 Allergic rhinitis due to pollen: Secondary | ICD-10-CM

## 2022-07-30 DIAGNOSIS — J324 Chronic pansinusitis: Secondary | ICD-10-CM

## 2022-08-11 ENCOUNTER — Other Ambulatory Visit (HOSPITAL_BASED_OUTPATIENT_CLINIC_OR_DEPARTMENT_OTHER): Payer: Self-pay | Admitting: Medical

## 2022-08-11 ENCOUNTER — Other Ambulatory Visit: Payer: Self-pay

## 2022-08-17 ENCOUNTER — Encounter (HOSPITAL_BASED_OUTPATIENT_CLINIC_OR_DEPARTMENT_OTHER): Payer: Self-pay

## 2022-08-17 ENCOUNTER — Encounter: Payer: Self-pay | Admitting: Nurse Practitioner

## 2022-08-17 DIAGNOSIS — J324 Chronic pansinusitis: Secondary | ICD-10-CM

## 2022-08-17 DIAGNOSIS — J301 Allergic rhinitis due to pollen: Secondary | ICD-10-CM

## 2022-08-18 ENCOUNTER — Other Ambulatory Visit: Payer: Self-pay

## 2022-08-18 ENCOUNTER — Other Ambulatory Visit (HOSPITAL_BASED_OUTPATIENT_CLINIC_OR_DEPARTMENT_OTHER): Payer: Self-pay | Admitting: *Deleted

## 2022-08-18 MED ORDER — NATAZIA 3/2-2/2-3/1 MG PO TABS
1.0000 | ORAL_TABLET | Freq: Every day | ORAL | 4 refills | Status: DC
  Filled 2022-08-18: qty 84, 84d supply, fill #0
  Filled 2022-12-03: qty 84, 84d supply, fill #1
  Filled 2023-02-23: qty 84, 84d supply, fill #2
  Filled 2023-05-30: qty 84, 84d supply, fill #3

## 2022-08-27 ENCOUNTER — Telehealth: Payer: Self-pay | Admitting: Nurse Practitioner

## 2022-08-27 NOTE — Telephone Encounter (Signed)
Pt  said you referred her to [facility] but pt but patient said no one answer the phone and she left several messages. Can you please refer her to another ENT office preferred in [place]. Please call her to let her know something

## 2022-09-03 ENCOUNTER — Ambulatory Visit (INDEPENDENT_AMBULATORY_CARE_PROVIDER_SITE_OTHER): Payer: 59

## 2022-09-03 DIAGNOSIS — Z23 Encounter for immunization: Secondary | ICD-10-CM

## 2022-09-04 ENCOUNTER — Ambulatory Visit: Payer: Self-pay | Admitting: Internal Medicine

## 2022-09-11 ENCOUNTER — Other Ambulatory Visit: Payer: Self-pay | Admitting: Nurse Practitioner

## 2022-09-11 DIAGNOSIS — J301 Allergic rhinitis due to pollen: Secondary | ICD-10-CM

## 2022-09-11 NOTE — Progress Notes (Signed)
After obtaining consent, and per orders of [facility], injection of Shingrix given IM in the right deltoid by Pamala Hurry Romeo-White. Patient instructed to remain in clinic for 20 minutes afterwards, and to report any adverse reaction to me immediately.

## 2022-09-14 ENCOUNTER — Other Ambulatory Visit (HOSPITAL_COMMUNITY): Payer: Self-pay

## 2022-09-14 ENCOUNTER — Other Ambulatory Visit: Payer: Self-pay

## 2022-09-14 MED ORDER — FLUTICASONE PROPIONATE 50 MCG/ACT NA SUSP
2.0000 | Freq: Every day | NASAL | 2 refills | Status: DC
  Filled 2022-09-14: qty 16, 30d supply, fill #0
  Filled 2022-10-10: qty 16, 30d supply, fill #1
  Filled 2022-12-03: qty 16, 30d supply, fill #2

## 2022-09-25 ENCOUNTER — Other Ambulatory Visit (HOSPITAL_BASED_OUTPATIENT_CLINIC_OR_DEPARTMENT_OTHER): Payer: Self-pay | Admitting: Nurse Practitioner

## 2022-09-25 DIAGNOSIS — Z1231 Encounter for screening mammogram for malignant neoplasm of breast: Secondary | ICD-10-CM

## 2022-09-30 DIAGNOSIS — H5213 Myopia, bilateral: Secondary | ICD-10-CM | POA: Diagnosis not present

## 2022-10-10 ENCOUNTER — Other Ambulatory Visit (HOSPITAL_COMMUNITY): Payer: Self-pay

## 2022-10-13 ENCOUNTER — Encounter: Payer: Self-pay | Admitting: Skilled Nursing Facility1

## 2022-10-13 ENCOUNTER — Encounter: Payer: 59 | Attending: Nurse Practitioner | Admitting: Skilled Nursing Facility1

## 2022-10-13 VITALS — Ht 65.0 in | Wt 143.1 lb

## 2022-10-13 DIAGNOSIS — E631 Imbalance of constituents of food intake: Secondary | ICD-10-CM | POA: Insufficient documentation

## 2022-10-13 NOTE — Progress Notes (Signed)
Medical Nutrition Therapy Primary concerns today: [employer] employee   NUTRITION ASSESSMENT   Clinical Medical Hx: thyroid cancer Medications: see list Labs: WNL  Notable Signs/Symptoms: snoring   Lifestyle & Dietary Hx  Pt states some of their meals are vegetarian.   Body Composition Scale 10/13/2022  Current Body Weight 143.1  Total Body Fat % 28.2  Visceral Fat 6  Fat-Free Mass % 71.7   Total Body Water % 50.3  Muscle-Mass lbs 30.1  BMI 23.5  Body Fat Displacement          Torso  lbs 24.9         Left Leg  lbs 4.9         Right Leg  lbs 4.9         Left Arm  lbs 2.4         Right Arm   lbs 2.4    Estimated daily fluid intake: 30 oz Supplements: N/A Sleep: "pretty good" some snoring so sleeping on the couch Stress / self-care: no concerns  Current average weekly physical activity: walking her dog, row machine 5 days a week 10-30 minutes high intensity, floor exercises weights and cardio, mobility exercises, yoga/pilates generally 45 minutes 5 days a week  24-Hr Dietary Recall: eating fast food 2 times a month First Meal: belvita crackers sometimes with peanut butter Snack:  Second Meal: tuna salad on daves killer bread  Snack: yogurt with granola  Third Meal: pizza or shrimp + quinoa + vegetables Snack: dark chocolate dove Beverages: black coffee, water, whole milk, alcoholic beverages    NUTRITION INTERVENTION  Nutrition education (E-1) on the following topics:  Creation of balanced and diverse meals to increase the intake of nutrient-rich foods that provide essential vitamins, minerals, fiber, and phytonutrients Variety of Fruits and Vegetables: Aim for a colorful array of fruits and vegetables to ensure a wide range of nutrients. Include a mix of leafy greens, berries, citrus fruits, cruciferous vegetables, and more. Whole Grains: Choose whole grains over refined grains. Examples include brown rice, quinoa, oats, whole wheat, and barley. Lean  Proteins: Include lean sources of protein, such as poultry, fish, tofu, legumes, beans, lentils, and low-fat dairy products. Limit red and processed meats. Healthy Fats: Incorporate sources of healthy fats, including avocados, nuts, seeds, and olive oil. Limit saturated and trans fats found in fried and processed foods. Dairy or Dairy Alternatives: Choose low-fat or fat-free dairy products, or plant-based alternatives like almond or soy milk. Portion Control: Be mindful of portion sizes to avoid overeating. Pay attention to hunger and satisfaction cues. Limit Added Sugars: Minimize the consumption of sugary beverages, snacks, and desserts. Check food labels for added sugars and opt for natural sources of sweetness such as whole fruits. Hydration: Drink plenty of water throughout the day. Limit sugary drinks and excessive caffeine intake. Moderate Sodium Intake: Reduce the consumption of high-sodium foods. Use herbs and spices for flavor instead of excessive salt. Meal Planning and Preparation: Plan and prepare meals ahead of time to make healthier choices more convenient. Include a mix of food groups in each meal. Limit Processed Foods: Minimize the intake of highly processed and packaged foods that are often high in added sugars, salt, and unhealthy fats. Regular Physical Activity: Combine a healthy diet with regular physical activity for overall well-being. Aim for at least 150 minutes of moderate-intensity aerobic exercise per week, along with strength training. Moderation and Balance: Enjoy treats and indulgent foods in moderation, emphasizing balance rather than strict restriction.  Learning Style & Readiness for Change Teaching method utilized: Visual & Auditory  Demonstrated degree of understanding via: Teach Back  Barriers to learning/adherence to lifestyle change: none identified  Goals Established by Pt Increase water to 64 ounces per day   MONITORING &  EVALUATION Dietary intake, weekly physical activity  Next Steps  Patient is to follow up for second employee appt.

## 2022-10-16 ENCOUNTER — Other Ambulatory Visit: Payer: Self-pay

## 2022-10-16 ENCOUNTER — Encounter: Payer: Self-pay | Admitting: Internal Medicine

## 2022-10-16 ENCOUNTER — Ambulatory Visit: Payer: 59 | Admitting: Internal Medicine

## 2022-10-16 VITALS — BP 132/82 | HR 92 | Temp 98.0°F | Resp 18 | Ht 65.0 in | Wt 144.3 lb

## 2022-10-16 DIAGNOSIS — R0683 Snoring: Secondary | ICD-10-CM | POA: Diagnosis not present

## 2022-10-16 DIAGNOSIS — J3089 Other allergic rhinitis: Secondary | ICD-10-CM

## 2022-10-16 DIAGNOSIS — J302 Other seasonal allergic rhinitis: Secondary | ICD-10-CM

## 2022-10-16 MED ORDER — AZELASTINE HCL 0.1 % NA SOLN
2.0000 | Freq: Two times a day (BID) | NASAL | 12 refills | Status: AC
  Filled 2022-10-16: qty 30, 50d supply, fill #0
  Filled 2023-02-23: qty 30, 50d supply, fill #1
  Filled 2023-04-20: qty 30, 50d supply, fill #2

## 2022-10-16 MED ORDER — IPRATROPIUM BROMIDE 0.06 % NA SOLN
2.0000 | Freq: Four times a day (QID) | NASAL | 12 refills | Status: AC
  Filled 2022-10-16: qty 15, 19d supply, fill #0
  Filled 2023-02-23: qty 15, 19d supply, fill #1
  Filled 2023-04-20: qty 15, 19d supply, fill #2

## 2022-10-16 NOTE — Patient Instructions (Addendum)
Chronic Rhinitis Seasonal and Perennial Allergic: - allergy testing today: grass, weed, mold, dust mite   - Prevention:  - allergen avoidance when possible - consider allergy shots as long term control of your symptoms by teaching your immune system to be more tolerant of your allergy triggers  - Symptom control: - Continue Nasal Steroid Spray: Best results if used daily. - Options include Flonase (fluticasone), Nasocort (triamcinolone), Nasonex (mometasome) 1- 2 sprays in each nostril daily.  - All can be purchased over-the-counter if not covered by insurance. - Consider Astelin (azelastine) 1-2 sprays in each nostril twice a day as needed for nasal congestion/itchy nose - Start Atrovent (Ipratropium Bromide) 1-2 sprays in each nostril up to 3 times a day as needed for runny nose/post nasal drip/drainage.   - Use less frequently if airway gets too dry.. - Continue Antihistamine: daily or daily as needed.   -Options include Zyrtec (Cetirizine) 10mg , Claritin (Loratadine) 10mg , Allegra (Fexofenadine) 180mg , or Xyzal (Levocetirinze) 5mg  - Can be purchased over-the-counter if not covered by insurance.  Allergic Conjunctivitis:  - Consider Allergy Eye drops-great options include Pataday (Olopatadine) or Zaditor (ketotifen) for eye symptoms daily as needed-both sold over the counter if not covered by insurance. and Rewetting Drops such as Systane,TheraTears, etc  -Avoid eye drops that say red eye relief as they may contain medications that dry out your eyes.  Follow up: 6 months   Thank you so much for letting me partake in your care today.  Don't hesitate to reach out if you have any additional concerns!  Christy Luz, MD  [facility] [place], [place]  Reducing Pollen Exposure  The American Academy of Allergy, Asthma and Immunology suggests the following steps to reduce your exposure to pollen during allergy seasons.    Do not hang sheets or clothing out to dry; pollen  may collect on these items. Do not mow lawns or spend time around freshly cut grass; mowing stirs up pollen. Keep windows closed at night.  Keep car windows closed while driving. Minimize morning activities outdoors, a time when pollen counts are usually at their highest. Stay indoors as much as possible when pollen counts or humidity is high and on windy days when pollen tends to remain in the air longer. Use air conditioning when possible.  Many air conditioners have filters that trap the pollen spores. Use a HEPA room air filter to remove pollen form the indoor air you breathe.  DUST MITE AVOIDANCE MEASURES:  There are three main measures that need and can be taken to avoid house dust mites:  Reduce accumulation of dust in general -reduce furniture, clothing, carpeting, books, stuffed animals, especially in bedroom  Separate yourself from the dust -use pillow and mattress encasements (can be found at stores such as [employer] or online) -avoid direct exposure to air condition flow -use a HEPA filter device, especially in the bedroom; you can also use a HEPA filter vacuum cleaner -wipe dust with a moist towel instead of a dry towel or broom when cleaning  Decrease mites and/or their secretions -wash clothing and linen and stuffed animals at highest temperature possible, at least every 2 weeks -stuffed animals can also be placed in a bag and put in a freezer overnight  Despite the above measures, it is impossible to eliminate dust mites or their allergen completely from your home.  With the above measures the burden of mites in your home can be diminished, with the goal of minimizing  your allergic symptoms.  Success will be reached only when implementing and using all means together.  Control of Mold Allergen   Mold and fungi can grow on a variety of surfaces provided certain temperature and moisture conditions exist.  Outdoor molds grow on plants, decaying vegetation and  soil.  The major outdoor mold, Alternaria and Cladosporium, are found in very high numbers during hot and dry conditions.  Generally, a late Summer - Fall peak is seen for common outdoor fungal spores.  Rain will temporarily lower outdoor mold spore count, but counts rise rapidly when the rainy period ends.  The most important indoor molds are Aspergillus and Penicillium.  Dark, humid and poorly ventilated basements are ideal sites for mold growth.  The next most common sites of mold growth are the bathroom and the kitchen.  Outdoor (Seasonal) Mold Control   Use air conditioning and keep windows closed Avoid exposure to decaying vegetation. Avoid leaf raking. Avoid grain handling. Consider wearing a face mask if working in moldy areas.    Indoor (Perennial) Mold Control    Maintain humidity below 50%. Clean washable surfaces with 5% bleach solution. Remove sources e.g. contaminated carpets.

## 2022-10-16 NOTE — Progress Notes (Signed)
New Patient Note  RE: Christy BLITZER MRN: [id number] DOB: 17-Jan-1971 Date of Office Visit: 10/16/2022  Consult requested by: Anne Ng, NP Primary care provider: Anne Ng, NP  Chief Complaint: Allergy Testing (Environmental: Seasonal/Perennial; dog/Food: No Hx)  History of Present Illness: I had the pleasure of seeing Christy [name] for initial evaluation at the [facility] on 10/16/2022. She is a 52 y.o. female, who is referred here by Christy, [name], NP for the evaluation of rhinitis .  History obtained from patient, chart review.  Chronic rhinitis: started 12 months ago, improved recently over the past 2 months  Symptoms include: snoring, nosebleeds, scabs nasal congestion, rhinorrhea, and post nasal drainage  Occurs year-round with increase symptoms this spring (ocular itching, sneezing)  Potential triggers: alcohol Treatments tried: raising head of bed, nasal rinses, 1/4 benadryl, cetirizine  Previous allergy testing: no History of reflux/heartburn: no History of chronic sinusitis or sinus surgery:  tonisllectomy  Nonallergic triggers: alcohol    Assessment and Plan: [name] is a 52 y.o. female with: Seasonal and perennial allergic rhinitis - Plan: Allergy Test, Interdermal Allergy Test  Snoring   Plan: Patient Instructions  Chronic Rhinitis Seasonal and Perennial Allergic: - allergy testing today: grass, weed, mold, dust mite   - Prevention:  - allergen avoidance when possible - consider allergy shots as long term control of your symptoms by teaching your immune system to be more tolerant of your allergy triggers  - Symptom control: - Continue Nasal Steroid Spray: Best results if used daily. - Options include Flonase (fluticasone), Nasocort (triamcinolone), Nasonex (mometasome) 1- 2 sprays in each nostril daily.  - All can be purchased over-the-counter if not covered by insurance. - Consider Astelin (azelastine) 1-2  sprays in each nostril twice a day as needed for nasal congestion/itchy nose - Start Atrovent (Ipratropium Bromide) 1-2 sprays in each nostril up to 3 times a day as needed for runny nose/post nasal drip/drainage.   - Use less frequently if airway gets too dry.. - Continue Antihistamine: daily or daily as needed.   -Options include Zyrtec (Cetirizine) 10mg , Claritin (Loratadine) 10mg , Allegra (Fexofenadine) 180mg , or Xyzal (Levocetirinze) 5mg  - Can be purchased over-the-counter if not covered by insurance.  Allergic Conjunctivitis:  - Consider Allergy Eye drops-great options include Pataday (Olopatadine) or Zaditor (ketotifen) for eye symptoms daily as needed-both sold over the counter if not covered by insurance. and Rewetting Drops such as Systane,TheraTears, etc  -Avoid eye drops that say red eye relief as they may contain medications that dry out your eyes.  Follow up: 6 months   Thank you so much for letting me partake in your care today.  Don't hesitate to reach out if you have any additional concerns!  Ferol Luz, MD  [facility] [place] [place]  Reducing Pollen Exposure  The American Academy of Allergy, Asthma and Immunology suggests the following steps to reduce your exposure to pollen during allergy seasons.    Do not hang sheets or clothing out to dry; pollen may collect on these items. Do not mow lawns or spend time around freshly cut grass; mowing stirs up pollen. Keep windows closed at night.  Keep car windows closed while driving. Minimize morning activities outdoors, a time when pollen counts are usually at their highest. Stay indoors as much as possible when pollen counts or humidity is high and on windy days when pollen tends to remain in the air longer. Use air conditioning when possible.  Many air conditioners have filters that trap the pollen spores. Use a HEPA room air filter to remove pollen form the indoor air you breathe.  DUST MITE AVOIDANCE  MEASURES:  There are three main measures that need and can be taken to avoid house dust mites:  Reduce accumulation of dust in general -reduce furniture, clothing, carpeting, books, stuffed animals, especially in bedroom  Separate yourself from the dust -use pillow and mattress encasements (can be found at stores such as [employer] or online) -avoid direct exposure to air condition flow -use a HEPA filter device, especially in the bedroom; you can also use a HEPA filter vacuum cleaner -wipe dust with a moist towel instead of a dry towel or broom when cleaning  Decrease mites and/or their secretions -wash clothing and linen and stuffed animals at highest temperature possible, at least every 2 weeks -stuffed animals can also be placed in a bag and put in a freezer overnight  Despite the above measures, it is impossible to eliminate dust mites or their allergen completely from your home.  With the above measures the burden of mites in your home can be diminished, with the goal of minimizing your allergic symptoms.  Success will be reached only when implementing and using all means together.  Control of Mold Allergen   Mold and fungi can grow on a variety of surfaces provided certain temperature and moisture conditions exist.  Outdoor molds grow on plants, decaying vegetation and soil.  The major outdoor mold, Alternaria and Cladosporium, are found in very high numbers during hot and dry conditions.  Generally, a late Summer - Fall peak is seen for common outdoor fungal spores.  Rain will temporarily lower outdoor mold spore count, but counts rise rapidly when the rainy period ends.  The most important indoor molds are Aspergillus and Penicillium.  Dark, humid and poorly ventilated basements are ideal sites for mold growth.  The next most common sites of mold growth are the bathroom and the kitchen.  Outdoor (Seasonal) Mold Control   Use air conditioning and keep windows  closed Avoid exposure to decaying vegetation. Avoid leaf raking. Avoid grain handling. Consider wearing a face mask if working in moldy areas.    Indoor (Perennial) Mold Control    Maintain humidity below 50%. Clean washable surfaces with 5% bleach solution. Remove sources e.g. contaminated carpets.      Meds ordered this encounter  Medications   ipratropium (ATROVENT) 0.06 % nasal spray    Sig: Place 2 sprays into both nostrils 4 (four) times daily.    Dispense:  15 mL    Refill:  12   azelastine (ASTELIN) 0.1 % nasal spray    Sig: Place 2 sprays into both nostrils 2 (two) times daily. Use in each nostril as directed    Dispense:  30 mL    Refill:  12   Lab Orders  No laboratory test(s) ordered today    Other allergy screening: Asthma: no Rhino conjunctivitis: yes Food allergy: no Medication allergy: no Hymenoptera allergy: no Urticaria: no Eczema:no History of recurrent infections suggestive of immunodeficency: no  Diagnostics: Skin Testing: Environmental allergy panel.  adequate controls  Results interpreted by myself and discussed with patient/family.  Airborne Adult Perc - 10/16/22 1421     Time Antigen Placed 1421    Allergen Manufacturer Waynette Buttery    Location Back    Number of Test 55    Panel 1 Select    2. Control-Histamine 4+  3. Bahia Negative    4. French Southern Territories Negative    5. Johnson 2+    6. Kentucky Blue Negative    7. Meadow Fescue Negative    8. Perennial Rye Negative    9. Timothy 2+    10. Ragweed Mix Negative    11. Cocklebur Negative    12. Plantain,  English 2+    13. Baccharis Negative    14. Dog Fennel Negative    15. Russian Thistle Negative    16. Lamb's Quarters Negative    17. Sheep Sorrell Negative    18. Rough Pigweed Negative    19. Marsh Elder, Rough Negative    20. Mugwort, Common Negative    21. Box, Elder Negative    22. Cedar, red Negative    23. Sweet Gum Negative    24. Pecan Pollen Negative    25. Pine Mix  Negative    26. Walnut, Black Pollen Negative    27. Red Mulberry Negative    28. Ash Mix Negative    29. Birch Mix Negative    30. Beech American Negative    31. Cottonwood, Guinea-Bissau Negative    32. Hickory, White Negative    33. Maple Mix Negative    34. Oak, Guinea-Bissau Mix Negative    35. Sycamore Eastern Negative    36. Alternaria Alternata Negative    37. Cladosporium Herbarum Negative    38. Aspergillus Mix Negative    39. Penicillium Mix Negative    40. Bipolaris Sorokiniana (Helminthosporium) Negative    41. Drechslera Spicifera (Curvularia) Negative    42. Mucor Plumbeus Negative    43. Fusarium Moniliforme Negative    44. Aureobasidium Pullulans (pullulara) Negative    45. Rhizopus Oryzae Negative    46. Botrytis Cinera Negative    47. Epicoccum Nigrum 2+    48. Phoma Betae 2+    49. Dust Mite Mix 3+    50. Cat Hair 10,000 BAU/ml Negative    51.  Dog Epithelia Negative    52. Mixed Feathers Negative    53. Horse Epithelia Negative    54. Cockroach, German Negative    55. Tobacco Leaf Negative             Intradermal - 10/16/22 1453     Time Antigen Placed 1453    Allergen Manufacturer Waynette Buttery    Location Arm    Number of Test 12    Intradermal Select    Control Negative    Bahia Negative    French Southern Territories Negative    Ragweed Mix Negative    Tree Mix Negative    Mold 1 Negative    Mold 2 Negative    Mold 3 Negative    Mold 4 3+    Cat Negative    Dog Negative    Cockroach Negative             Past Medical History: Patient Active Problem List   Diagnosis Date Noted   Chronic pansinusitis 11/17/2021   Right hip pain 08/27/2017   Skin mole 08/27/2017   Chronic seasonal allergic rhinitis due to pollen 08/27/2017   Freckled skin 08/27/2017   S/P complete thyroidectomy 10/02/2013   Iatrogenic hypothyroidism 10/02/2013   Past Medical History:  Diagnosis Date   Allergy    seasonal   Thyroid cancer (HCC) 2004   s/p thyroidectomy    Past Surgical  History: Past Surgical History:  Procedure Laterality Date   CYST EXCISION Right    "  on rib cage"   THYROIDECTOMY  05/18/2002   TONSILLECTOMY     age 57   Medication List:  Current Outpatient Medications  Medication Sig Dispense Refill   azelastine (ASTELIN) 0.1 % nasal spray Place 2 sprays into both nostrils 2 (two) times daily. Use in each nostril as directed 30 mL 12   cetirizine (ZYRTEC) 10 MG tablet Take 1 tablet (10 mg total) by mouth daily. 30 tablet 11   diclofenac Sodium (VOLTAREN) 1 % GEL Apply 4 g topically 4 (four) times daily as needed. 100 g 3   diphenhydrAMINE (BENADRYL) 25 mg capsule as needed.     fluticasone (FLONASE) 50 MCG/ACT nasal spray Place 2 sprays into both nostrils daily. 16 g 2   ipratropium (ATROVENT) 0.06 % nasal spray Place 2 sprays into both nostrils 4 (four) times daily. 15 mL 12   Melatonin 3 MG TABS Take by mouth.     meloxicam (MOBIC) 15 MG tablet Take 1 tablet (15 mg total) by mouth daily. 30 tablet 0   NATAZIA 3/2-2/2-3/1 MG tablet Take 1 tablet by mouth daily. 84 tablet 4   SYNTHROID 125 MCG tablet TAKE 1 TABLET BY MOUTH EVERY MORNING BEFORE BREAKFAST 90 tablet 3   No current facility-administered medications for this visit.   Allergies: Allergies  Allergen Reactions   Neosporin [Neomycin-Bacitracin Zn-Polymyx]    Thyrogen [Thyrotropin] Other (See Comments)    Thyrogen allergy >> needs pretreatment with steroids   Social History: Social History   Socioeconomic History   Marital status: Married    Spouse name: Not on file   Number of children: 0   Years of education: Not on file   Highest education level: Not on file  Occupational History   Occupation: IT    Employer: [employer]  Tobacco Use   Smoking status: Never    Passive exposure: Never   Smokeless tobacco: Never  Vaping Use   Vaping Use: Never used  Substance and Sexual Activity   Alcohol use: Yes    Alcohol/week: 3.0 standard drinks of alcohol    Types: 2 Cans of beer,  1 Standard drinks or equivalent per week    Comment: Up to 1-2 drinks a week.   Drug use: Never   Sexual activity: Yes    Birth control/protection: Pill  Other Topics Concern   Not on file  Social History Narrative   Not on file   Social Determinants of Health   Financial Resource Strain: Not on file  Food Insecurity: Not on file  Transportation Needs: Not on file  Physical Activity: Sufficiently Active (06/06/2021)   Exercise Vital Sign    Days of Exercise per Week: 7 days    Minutes of Exercise per Session: 50 min  Stress: Not on file  Social Connections: Not on file   Lives in a single-family home that is 51 years old.  There are no roaches in the house and bed is 2 feet on the floor.  No dust mite precautions.  Not exposed to fumes, chemicals or dust.  At her job but does through her hobbies.  [place] not [place] an interstate industrial area. 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, government, works as a Chief Technology Officer with [employer]  Environmental History: Water Damage/mildew in the house: no Engineer, civil (consulting) in the family room: no Carpet in the bedroom: no Heating: electric Cooling: central Pet: yes dog access to bedroom  Family History: Family History  Problem Relation Age of Onset   Arthritis  Mother    Colon polyps Mother    Depression Mother    Heart disease Father    Colon polyps Father    Arthritis Father    Diverticulitis Sister    Colon cancer Paternal Aunt    Dementia Maternal Grandmother    Heart disease Maternal Grandfather    Heart disease Paternal Grandfather    Depression Paternal Aunt    Obesity Paternal Aunt    Depression Sister    Crohn's disease Neg Hx    Esophageal cancer Neg Hx    Rectal cancer Neg Hx    Stomach cancer Neg Hx      ROS: All others negative except as noted per HPI.   Objective: BP 132/82 (BP Location: Right Arm, Patient Position: Sitting, Cuff Size: Normal)   Pulse 92   Temp 98 F (36.7 C) (Temporal)   Resp 18    Ht 5\' 5"  (1.651 m)   Wt 144 lb 4.8 oz (65.5 kg)   SpO2 99%   BMI 24.01 kg/m  Body mass index is 24.01 kg/m.  General Appearance:  Alert, cooperative, no distress, appears stated age  Head:  Normocephalic, without obvious abnormality, atraumatic  Eyes:  Conjunctiva clear, EOM's intact  Nose: Nares normal,  erythematous nasal mucosa, no visible anterior polyps, and septum midline  Throat: Lips, tongue normal; teeth and gums normal, normal posterior oropharynx  Neck: Supple, symmetrical  Lungs:   clear to auscultation bilaterally, Respirations unlabored, no coughing  Heart:  regular rate and rhythm and no murmur, Appears well perfused  Extremities: No edema  Skin: Skin color, texture, turgor normal, no rashes or lesions on visualized portions of skin  Neurologic: No gross deficits   The plan was reviewed with the patient/family, and all questions/concerned were addressed.  It was my pleasure to see Rosebella today and participate in her care. Please feel free to contact me with any questions or concerns.  Sincerely,  Ferol Luz, MD Allergy & Immunology  [facility] office: [phone] [facility] office: [phone]

## 2022-10-21 DIAGNOSIS — L814 Other melanin hyperpigmentation: Secondary | ICD-10-CM | POA: Diagnosis not present

## 2022-10-21 DIAGNOSIS — D225 Melanocytic nevi of trunk: Secondary | ICD-10-CM | POA: Diagnosis not present

## 2022-10-21 DIAGNOSIS — L821 Other seborrheic keratosis: Secondary | ICD-10-CM | POA: Diagnosis not present

## 2022-11-25 ENCOUNTER — Ambulatory Visit: Payer: 59 | Admitting: Skilled Nursing Facility1

## 2022-12-01 ENCOUNTER — Encounter: Payer: Self-pay | Admitting: Skilled Nursing Facility1

## 2022-12-01 ENCOUNTER — Encounter: Payer: 59 | Attending: Nurse Practitioner | Admitting: Skilled Nursing Facility1

## 2022-12-01 VITALS — Ht 65.0 in | Wt 143.0 lb

## 2022-12-01 DIAGNOSIS — E631 Imbalance of constituents of food intake: Secondary | ICD-10-CM | POA: Insufficient documentation

## 2022-12-01 NOTE — Progress Notes (Signed)
Medical Nutrition Therapy Primary concerns today: [employer] employee   NUTRITION ASSESSMENT   Clinical Medical Hx: thyroid cancer Medications: see list Labs: WNL  Notable Signs/Symptoms: snoring   Lifestyle & Dietary Hx   Body Composition Scale 10/13/2022 12/01/2022  Current Body Weight 143.1 143  Total Body Fat % 28.2 28.1  Visceral Fat 6 6  Fat-Free Mass % 71.7 71.8   Total Body Water % 50.3 50.4  Muscle-Mass lbs 30.1 30.1  BMI 23.5 23.4  Body Fat Displacement           Torso  lbs 24.9 24.8         Left Leg  lbs 4.9 4.9         Right Leg  lbs 4.9 4.9         Left Arm  lbs 2.4 2.4         Right Arm   lbs 2.4 2.4    Estimated daily fluid intake: 30 oz Supplements: N/A Sleep: "pretty good" some snoring so sleeping on the couch Stress / self-care: no concerns  Current average weekly physical activity: walking her dog, row machine 5 days a week 10-30 minutes high intensity, floor exercises weights and cardio, mobility exercises, yoga/pilates generally 45 minutes 5 days a week  24-Hr Dietary Recall: eating fast food 2 times a month First Meal: belvita crackers sometimes with peanut butter Snack:  Second Meal: tuna salad on daves killer bread  Snack: yogurt with granola  Third Meal: pizza or shrimp + quinoa + vegetables Snack: dark chocolate dove Beverages: black coffee, water, whole milk, alcoholic beverages    NUTRITION INTERVENTION  Nutrition education (E-1) on the following topics:  Creation of balanced and diverse meals to increase the intake of nutrient-rich foods that provide essential vitamins, minerals, fiber, and phytonutrients Variety of Fruits and Vegetables: Aim for a colorful array of fruits and vegetables to ensure a wide range of nutrients. Include a mix of leafy greens, berries, citrus fruits, cruciferous vegetables, and more. Whole Grains: Choose whole grains over refined grains. Examples include brown rice, quinoa, oats, whole wheat, and  barley. Lean Proteins: Include lean sources of protein, such as poultry, fish, tofu, legumes, beans, lentils, and low-fat dairy products. Limit red and processed meats. Healthy Fats: Incorporate sources of healthy fats, including avocados, nuts, seeds, and olive oil. Limit saturated and trans fats found in fried and processed foods. Dairy or Dairy Alternatives: Choose low-fat or fat-free dairy products, or plant-based alternatives like almond or soy milk. Portion Control: Be mindful of portion sizes to avoid overeating. Pay attention to hunger and satisfaction cues. Limit Added Sugars: Minimize the consumption of sugary beverages, snacks, and desserts. Check food labels for added sugars and opt for natural sources of sweetness such as whole fruits. Hydration: Drink plenty of water throughout the day. Limit sugary drinks and excessive caffeine intake. Moderate Sodium Intake: Reduce the consumption of high-sodium foods. Use herbs and spices for flavor instead of excessive salt. Meal Planning and Preparation: Plan and prepare meals ahead of time to make healthier choices more convenient. Include a mix of food groups in each meal. Limit Processed Foods: Minimize the intake of highly processed and packaged foods that are often high in added sugars, salt, and unhealthy fats. Regular Physical Activity: Combine a healthy diet with regular physical activity for overall well-being. Aim for at least 150 minutes of moderate-intensity aerobic exercise per week, along with strength training. Moderation and Balance: Enjoy treats and indulgent foods in moderation, emphasizing  balance rather than strict restriction.   Learning Style & Readiness for Change Teaching method utilized: Visual & Auditory  Demonstrated degree of understanding via: Teach Back  Barriers to learning/adherence to lifestyle change: none identified  Goals Established by [employer] job!   MONITORING & EVALUATION Dietary  intake, weekly physical activity  Next Steps  Patient is to follow up: pt adivsed to call or email with any future concerns or questions

## 2022-12-03 ENCOUNTER — Other Ambulatory Visit (HOSPITAL_COMMUNITY): Payer: Self-pay

## 2022-12-03 ENCOUNTER — Other Ambulatory Visit: Payer: Self-pay | Admitting: Oncology

## 2022-12-03 ENCOUNTER — Other Ambulatory Visit: Payer: Self-pay

## 2022-12-03 DIAGNOSIS — Z006 Encounter for examination for normal comparison and control in clinical research program: Secondary | ICD-10-CM

## 2022-12-04 ENCOUNTER — Other Ambulatory Visit: Payer: Self-pay

## 2022-12-08 ENCOUNTER — Ambulatory Visit (HOSPITAL_BASED_OUTPATIENT_CLINIC_OR_DEPARTMENT_OTHER)
Admission: RE | Admit: 2022-12-08 | Discharge: 2022-12-08 | Disposition: A | Discharge status: Home / Self Care | Payer: 59 | Source: Ambulatory Visit | Attending: Nurse Practitioner | Admitting: Nurse Practitioner

## 2022-12-08 DIAGNOSIS — Z1231 Encounter for screening mammogram for malignant neoplasm of breast: Secondary | ICD-10-CM | POA: Diagnosis not present

## 2022-12-10 ENCOUNTER — Ambulatory Visit (HOSPITAL_BASED_OUTPATIENT_CLINIC_OR_DEPARTMENT_OTHER): Payer: 59 | Admitting: Radiology

## 2022-12-14 ENCOUNTER — Other Ambulatory Visit (HOSPITAL_COMMUNITY): Payer: Self-pay

## 2022-12-14 DIAGNOSIS — R09A2 Foreign body sensation, throat: Secondary | ICD-10-CM | POA: Diagnosis not present

## 2022-12-14 DIAGNOSIS — R0683 Snoring: Secondary | ICD-10-CM | POA: Diagnosis not present

## 2022-12-14 DIAGNOSIS — R0989 Other specified symptoms and signs involving the circulatory and respiratory systems: Secondary | ICD-10-CM | POA: Diagnosis not present

## 2022-12-14 MED ORDER — FAMOTIDINE 40 MG PO TABS
20.0000 mg | ORAL_TABLET | Freq: Every evening | ORAL | 3 refills | Status: DC
  Filled 2022-12-14: qty 45, 90d supply, fill #0

## 2022-12-14 MED ORDER — OMEPRAZOLE 40 MG PO CPDR
40.0000 mg | DELAYED_RELEASE_CAPSULE | Freq: Every day | ORAL | 3 refills | Status: DC
  Filled 2022-12-14: qty 90, 90d supply, fill #0

## 2022-12-15 ENCOUNTER — Other Ambulatory Visit (HOSPITAL_COMMUNITY): Payer: Self-pay

## 2023-01-11 ENCOUNTER — Other Ambulatory Visit (HOSPITAL_COMMUNITY): Payer: Self-pay

## 2023-01-11 ENCOUNTER — Ambulatory Visit: Payer: 59 | Admitting: Internal Medicine

## 2023-01-11 ENCOUNTER — Other Ambulatory Visit: Payer: Self-pay | Admitting: Nurse Practitioner

## 2023-01-11 ENCOUNTER — Encounter: Payer: Self-pay | Admitting: Internal Medicine

## 2023-01-11 VITALS — BP 120/70 | HR 85 | Ht 65.0 in | Wt 144.2 lb

## 2023-01-11 DIAGNOSIS — J301 Allergic rhinitis due to pollen: Secondary | ICD-10-CM

## 2023-01-11 DIAGNOSIS — E032 Hypothyroidism due to medicaments and other exogenous substances: Secondary | ICD-10-CM

## 2023-01-11 DIAGNOSIS — C73 Malignant neoplasm of thyroid gland: Secondary | ICD-10-CM

## 2023-01-11 LAB — T4, FREE: Free T4: 1.46 ng/dL (ref 0.60–1.60)

## 2023-01-11 LAB — TSH: TSH: 0.33 u[IU]/mL — ABNORMAL LOW (ref 0.35–5.50)

## 2023-01-11 MED ORDER — FLUTICASONE PROPIONATE 50 MCG/ACT NA SUSP
2.0000 | Freq: Every day | NASAL | 2 refills | Status: DC
  Filled 2023-01-11: qty 16, 30d supply, fill #0
  Filled 2023-02-23: qty 16, 30d supply, fill #1
  Filled 2023-04-20: qty 16, 30d supply, fill #2

## 2023-01-11 NOTE — Patient Instructions (Signed)
Please stop at the lab.  Please continue Synthroid 125 mcg daily.  Take the thyroid hormone every day, with water, at least 30 minutes before breakfast, separated by at least 4 hours from: - acid reflux medications - calcium - iron - multivitamins  Please return in 1 year.

## 2023-01-11 NOTE — Progress Notes (Signed)
Patient ID: TALICIA GOSIER, female   DOB: May 25, 1970, 52 y.o.   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 She was seen by Dr Talmage Nap in the past.  Last visit with me 1 year ago.  Interim history: She feels well at today's visit.  She describes that she was diagnosed with silent reflux by ENT after investigation for new onset snoring.  She was recommended a PPI before dinner but she is not taking it, only takes Pepcid.   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 Willeen Cass note, stimulated Tg was 2.9 08/10/2008 Thyrogen stimulated WBS negative 10/15/2009 ultrasound neck with 3 abnormal cervical lymph nodes 11/08/2009 FNA of one of the lymph node that was found to be without fatty hilum: negative for metastases - she had a traumatic experience then and would not want to repeat this 09/26/2010 Thyrogen stimulated WBS negative except uptake in colon 10/07/2012 Thyrogen stimulated WBS negative  ! Patient has a Thyrogen allergy (pain and redness at the inj site) >> needs pretreatment with steroids 10/02/2013: Tg 0.2, ATA <1 10/16/2013: Neck U/S:  Small area of irregular tissue in the left thyroid bed measures roughly 1 cm in length and 0.5 cm in width. This is very ill-defined appearing and may represent scar tissue. Thyroid carcinoma recurrence cannot be completely excluded. Prior whole body nuclear medicine study was negative for any thyroid activity  in the neck. No abnormal tissue is identified in the right thyroid bed. No abnormal lymph nodes are identified. 09/05/2014: CT neck and chest: 1. Negative neck CT status post thyroidectomy. No soft tissue mass or lymphadenopathy corresponding to the right supraclavicular palpable area of concern, or elsewhere in the neck. 2. Negative chest CT.  No acute or metastatic process identified. 03/06/2020: Neck U/S: No suspicious masses  Also: 07/14/2006: Tg 1, ATA 3.6 03/2007: Tg <0.5, Stimulated Tg 2.7 12/2007: Tg <0.5, TSH 0.1 2013: Tg <0.5, ATA <20 09/16/2012: TSH 0.644, Tg 0.1, ATA <1 10/07/2012: Tg 2.0, ATA <1 06/28/2013: TSH 1.1  12/12/2014: TSH 1.45, 12/25/2014: ATA <1, Tg(RIA) <2.0  Most recent labs: Lab Results  Component Value Date   TSH 0.39 01/08/2022   TSH 1.10 01/07/2021   TSH 1.69 07/29/2020   TSH 7.51 (H) 05/06/2020   TSH 3.26 01/02/2020   TSH 1.75 12/09/2018   TSH 0.67 12/10/2017   TSH 1.84 08/30/2017   TSH 1.43 04/23/2017   TSH 0.21 (L) 02/19/2017   FREET4 1.51 01/08/2022   FREET4 1.22 01/07/2021   FREET4 1.24 07/29/2020   FREET4 1.25 05/06/2020   FREET4 1.19 01/02/2020   FREET4 1.31 12/09/2018   FREET4 1.34 12/10/2017   FREET4 1.28 04/23/2017   FREET4 1.53 02/19/2017   FREET4 1.42 12/11/2016   Lab Results  Component Value Date   THYROGLB <0.1 (L) 01/08/2022   THYROGLB 0.1 (L) 01/07/2021   THYROGLB 0.2 (L) 05/06/2020   THYROGLB 0.2 (L) 01/02/2020   THYROGLB 0.1 (L) 12/09/2018   THYROGLB 0.1 (L) 12/10/2017   THYROGLB <0.1 (L) 12/11/2016   THYROGLB 0.2 (L) 12/12/2015   THYROGLB <  2.0 12/25/2014   THGAB <1 01/08/2022   THGAB <1 01/07/2021   THGAB <1 05/06/2020   THGAB <1 01/02/2020   THGAB <1 12/09/2018   THGAB <1 12/10/2017   THGAB <1.0 12/11/2016   THGAB <1.0 12/12/2015   THGAB <1.0 12/25/2014   Pt is on Synthroid 125 d.a.w. mcg daily, taken: - in am - fasting - at least 45-60 min from b'fast - no Ca, Fe, was dx'ed with silent GERD >>  rec'd PPIs (not really taking it); but on Pepcid with dinner - off MVIs - not on Biotin now  Pt denies: - feeling nodules in neck - hoarseness - dysphagia - choking  She had a low vit D per ObGyn >> started vitamin D.  She is working from home and has time to go for walks frequently.  ROS: + see HPI  I reviewed pt's medications, allergies, PMH, social hx, family hx, and changes were documented in the history of present illness. Otherwise, unchanged from my initial visit note.  Past Medical History:  Diagnosis Date   Allergy    seasonal   Thyroid cancer (HCC) 2004   s/p thyroidectomy    History   Social History   Marital Status: Married    Spouse Name: N/A    Number of Children: 2   Occupational History   Systems analyst -    Social History Main Topics   Smoking status: Never Smoker    Smokeless tobacco: No   Alcohol Use: Occasional: wine, beer   Drug Use: No  Exercise: walk/run - daily   Current Outpatient Medications on File Prior to Visit  Medication Sig Dispense Refill   azelastine (ASTELIN) 0.1 % nasal spray Place 2 sprays into both nostrils 2 (two) times daily. Use in each nostril as directed 30 mL 12   cetirizine (ZYRTEC) 10 MG tablet Take 1 tablet (10 mg total) by mouth daily. 30 tablet 11   diclofenac Sodium (VOLTAREN) 1 % GEL Apply 4 g topically 4 (four) times daily as needed. 100 g 3   diphenhydrAMINE (BENADRYL) 25 mg capsule as needed.     famotidine (PEPCID) 40 MG tablet Take 0.5 tablets (20 mg total) by mouth Nightly. 90 tablet 3   fluticasone (FLONASE) 50 MCG/ACT nasal spray Place 2 sprays into both nostrils daily. 16 g 2   ipratropium (ATROVENT) 0.06 % nasal spray Place 2 sprays into both nostrils 4 (four) times daily. 15 mL 12   Melatonin 3 MG TABS Take by mouth.     meloxicam (MOBIC) 15 MG tablet Take 1 tablet (15 mg total) by mouth daily. 30 tablet 0   NATAZIA 3/2-2/2-3/1 MG tablet Take 1 tablet by mouth daily. 84 tablet 4   omeprazole  (PRILOSEC) 40 MG capsule Take 1 capsule (40 mg total) by mouth daily 1 hour before dinner on an empty stomach. 90 capsule 3   SYNTHROID 125 MCG tablet TAKE 1 TABLET BY MOUTH EVERY MORNING BEFORE BREAKFAST 90 tablet 3   No current facility-administered medications on file prior to visit.   Allergies  Allergen Reactions   Neosporin [Neomycin-Bacitracin Zn-Polymyx]    Thyrogen [Thyrotropin] Other (See Comments)    Thyrogen allergy >> needs pretreatment with steroids   FH: H/o heart ds. in father  PE: BP 120/70 (BP Location: Left Arm, Patient Position: Sitting, Cuff Size: Small)   Pulse 85   Ht 5\' 5"  (1.651 m)   Wt 144 lb 3.2 oz (65.4 kg)   SpO2 99%  BMI 24.00 kg/m  Wt Readings from Last 3 Encounters:  01/11/23 144 lb 3.2 oz (65.4 kg)  12/01/22 143 lb (64.9 kg)  10/16/22 144 lb 4.8 oz (65.5 kg)   Constitutional: normal weight, in NAD Eyes: no exophthalmos ENT: no masses palpated in neck, no cervical lymphadenopathy Cardiovascular: RRR, No MRG Respiratory: CTA B Musculoskeletal: no deformities Skin:  no rashes Neurological: no tremor with outstretched hands  ASSESSMENT: 1. Follicular thyroid cancer  2. Postsurgical Hypothyroidism  PLAN:  1. Follicular ThyCa -In remission -Her antithyroglobulin antibodies are negative, but they have been positive in 2008 -Thyroglobulin levels have been fluctuating, barely detectable, but at last visit, Tg level was <0.1, previously 0.1, previously 0.2 -Thyroglobulin ultrasound report from 2015 showed scar tissue versus thyroid remnant, but this has been present since her surgery, per records from Dr. Talmage Nap. This was the reason for many whole-body scans and PET scans that she had in the past.  She had a chest and neck CT scan in 2016, with contrast, which was negative for metastasis or recurrence.  Another neck ultrasound in 02/2020 was negative for any suspicious masses. -No neck compression symptoms or masses felt on palpation of her neck  today  -she will need a U/S at next OV or after results come back today, if Tg is <0.1 -At today visit, we will repeat her thyroglobulin and ATA antibodies -If the thyroglobulin starts rising, we will need to check a PET scan  2. Postsurgical hypothyroidism, on brand-name Synthroid therapy - latest thyroid labs reviewed with pt. >> normal: Lab Results  Component Value Date   TSH 0.39 01/08/2022  - she continues on Synthroid d.a.w.  125 mcg daily - pt feels good on this dose. - we discussed about taking the thyroid hormone every day, with water, >30 minutes before breakfast, separated by >4 hours from acid reflux medications, calcium, iron, multivitamins. Pt. is taking it correctly. - will check thyroid tests today: TSH and fT4 - If labs are abnormal, she will need to return for repeat TFTs in 1.5 months - OTW, I will see her back in a year  Needs refills - to [facility].  Component     Latest Ref Rng 01/11/2023  TSH     0.35 - 5.50 uIU/mL 0.33 (L)   T4,Free(Direct)     0.60 - 1.60 ng/dL 9.14   Thyroglobulin     ng/mL 0.1 (L)   Comment --   Thyroglobulin Ab     < or = 1 IU/mL <1    TSH is minimally low.  Thyroglobulin is detectable, at 0.1.  At this point, my suggestion would be to stay on the same dose of Synthroid for another approximately 3 months, then repeat a TSH.  If continuing to decrease, we will need to back off the Synthroid dose.  Carlus Pavlov, MD PhD [facility]

## 2023-01-14 LAB — THYROGLOBULIN LEVEL: Thyroglobulin: 0.1 ng/mL — ABNORMAL LOW

## 2023-01-14 LAB — THYROGLOBULIN ANTIBODY: Thyroglobulin Ab: <1 [IU]/mL (ref <=1)

## 2023-01-15 ENCOUNTER — Encounter: Payer: Self-pay | Admitting: Nurse Practitioner

## 2023-01-15 DIAGNOSIS — Z85850 Personal history of malignant neoplasm of thyroid: Secondary | ICD-10-CM | POA: Insufficient documentation

## 2023-01-15 DIAGNOSIS — R0683 Snoring: Secondary | ICD-10-CM | POA: Insufficient documentation

## 2023-01-15 DIAGNOSIS — R0989 Other specified symptoms and signs involving the circulatory and respiratory systems: Secondary | ICD-10-CM | POA: Insufficient documentation

## 2023-01-15 MED ORDER — SYNTHROID 125 MCG PO TABS: ORAL_TABLET | ORAL | 3 refills | Status: DC

## 2023-02-01 ENCOUNTER — Other Ambulatory Visit (HOSPITAL_BASED_OUTPATIENT_CLINIC_OR_DEPARTMENT_OTHER): Payer: Self-pay

## 2023-02-01 MED ORDER — COVID-19 MRNA VAC-TRIS(PFIZER) 30 MCG/0.3ML IM SUSY
0.3000 mL | PREFILLED_SYRINGE | Freq: Once | INTRAMUSCULAR | 0 refills | Status: AC
  Filled 2023-02-01: qty 0.3, 1d supply, fill #0

## 2023-02-01 MED ORDER — INFLUENZA VIRUS VACC SPLIT PF (FLUZONE) 0.5 ML IM SUSY
0.5000 mL | PREFILLED_SYRINGE | Freq: Once | INTRAMUSCULAR | 0 refills | Status: AC
  Filled 2023-02-01: qty 0.5, 1d supply, fill #0

## 2023-02-22 ENCOUNTER — Other Ambulatory Visit (HOSPITAL_BASED_OUTPATIENT_CLINIC_OR_DEPARTMENT_OTHER): Payer: Self-pay

## 2023-02-23 ENCOUNTER — Other Ambulatory Visit (HOSPITAL_COMMUNITY): Payer: Self-pay

## 2023-02-24 ENCOUNTER — Other Ambulatory Visit: Payer: Self-pay

## 2023-02-25 ENCOUNTER — Other Ambulatory Visit: Payer: Self-pay

## 2023-02-26 ENCOUNTER — Other Ambulatory Visit: Payer: Self-pay

## 2023-02-26 ENCOUNTER — Other Ambulatory Visit (HOSPITAL_COMMUNITY): Payer: Self-pay

## 2023-03-02 ENCOUNTER — Other Ambulatory Visit (HOSPITAL_COMMUNITY): Payer: Self-pay

## 2023-03-31 ENCOUNTER — Telehealth (INDEPENDENT_AMBULATORY_CARE_PROVIDER_SITE_OTHER): Payer: 59 | Admitting: Nurse Practitioner

## 2023-03-31 ENCOUNTER — Other Ambulatory Visit (HOSPITAL_BASED_OUTPATIENT_CLINIC_OR_DEPARTMENT_OTHER): Payer: Self-pay

## 2023-03-31 ENCOUNTER — Encounter: Payer: Self-pay | Admitting: Nurse Practitioner

## 2023-03-31 DIAGNOSIS — F418 Other specified anxiety disorders: Secondary | ICD-10-CM | POA: Diagnosis not present

## 2023-03-31 MED ORDER — ALPRAZOLAM 0.25 MG PO TABS
0.2500 mg | ORAL_TABLET | Freq: Three times a day (TID) | ORAL | 0 refills | Status: AC | PRN
Start: 2023-03-31 — End: ?
  Filled 2023-03-31: qty 21, 7d supply, fill #0

## 2023-03-31 NOTE — Progress Notes (Signed)
Virtual Visit via Video Note  I connected withNAME@ on 03/31/23 at 10:20 AM EST by a video enabled telemedicine application and verified that I am speaking with the correct person using two identifiers.  Location: Patient:Home Provider: Office Participants: patient and provider  I discussed the limitations of evaluation and management by telemedicine and the availability of in person appointments. I also discussed with the patient that there may be a patient responsible charge related to this service. The patient expressed understanding and agreed to proceed.  CC:PT C/O of anxiety for the past 2-3 days. PT is unsure if this due to work being overwhelming or election verdict. PT was very upset yesterday due to rough week. PT stated she is unsure if she is experiencing depression.   History of Present Illness:  Anxiety Presents for initial visit. Onset was 1 to 4 weeks ago. The problem has been unchanged. Symptoms include depressed mood, irritability, muscle tension, nervous/anxious behavior and restlessness. Patient reports no chest pain, compulsions, confusion, decreased concentration, dizziness, dry mouth, excessive worry, feeling of choking, hyperventilation, impotence, insomnia, nausea, obsessions, palpitations, panic, shortness of breath or suicidal ideas. Symptoms occur most days. The severity of symptoms is interfering with daily activities and causing significant distress. The symptoms are aggravated by social activities. The quality of sleep is good. Nighttime awakenings: occasional.   There are no known risk factors. There is no history of anemia, anxiety/panic attacks, arrhythmia, asthma, bipolar disorder, CAD, CHF, chronic lung disease, depression, fibromyalgia, hyperthyroidism or suicide attempts.   She thinks was triggered by the current political climate and recent Election results. Caffeine- decaf coffee in AM ALCOHOL: 1beer monthly     03/31/2023    9:53 AM 10/13/2022    9:51  AM 07/01/2022    9:26 AM  Depression screen PHQ 2/9  Decreased Interest 0 0 0  Down, Depressed, Hopeless 1 0 0  PHQ - 2 Score 1 0 0  Altered sleeping 0    Tired, decreased energy 0    Change in appetite 0    Feeling bad or failure about yourself  0    Trouble concentrating 0    Moving slowly or fidgety/restless 0    Suicidal thoughts 0    PHQ-9 Score 1    Difficult doing work/chores Not difficult at all         03/31/2023    9:53 AM  GAD 7 : Generalized Anxiety Score  Nervous, Anxious, on Edge 1  Control/stop worrying 1  Worry too much - different things 1  Trouble relaxing 0  Restless 0  Easily annoyed or irritable 2  Afraid - awful might happen 1  Total GAD 7 Score 6  Anxiety Difficulty Somewhat difficult   Observations/Objective: Physical Exam Nursing note reviewed.  Pulmonary:     Effort: Pulmonary effort is normal.  Neurological:     Mental Status: She is alert and oriented to person, place, and time.  Psychiatric:        Attention and Perception: Attention normal.        Mood and Affect: Affect is tearful.        Speech: Speech normal.        Behavior: Behavior is cooperative.        Thought Content: Thought content normal.        Cognition and Memory: Cognition and memory normal.        Judgment: Judgment is not impulsive.     Assessment and Plan: Christy [name] was seen today for  office visit  and video visit .  Diagnoses and all orders for this visit:  Situational anxiety -     ALPRAZolam (XANAX) 0.25 MG tablet; Take 1 tablet (0.25 mg total) by mouth 3 (three) times daily as needed for anxiety.  We discussed use of vistaril vs trazodone vs alprazolam, their benefits vs possible side effects including risk of dependence. We also discuss need for counseling through [facility]. Advised to avoid political conversations. Follow Up Instructions: She agreed to use alprazolam and to reach out to [facility] via employer. She also agreed to call office for another appointment if no  improvement in 2weeks. Will consider repeat thyroid function sooner if no improvement in mood   I discussed the assessment and treatment plan with the patient. The patient was provided an opportunity to ask questions and all were answered. The patient agreed with the plan and demonstrated an understanding of the instructions.   The patient was advised to call back or seek an in-person evaluation if the symptoms worsen or if the condition fails to improve as anticipated.  Alysia Penna, NP

## 2023-04-05 ENCOUNTER — Other Ambulatory Visit (INDEPENDENT_AMBULATORY_CARE_PROVIDER_SITE_OTHER): Payer: 59

## 2023-04-05 DIAGNOSIS — E032 Hypothyroidism due to medicaments and other exogenous substances: Secondary | ICD-10-CM

## 2023-04-05 LAB — T4, FREE: Free T4: 1.31 ng/dL (ref 0.60–1.60)

## 2023-04-05 LAB — TSH: TSH: 0.56 u[IU]/mL (ref 0.35–5.50)

## 2023-04-21 ENCOUNTER — Other Ambulatory Visit: Payer: Self-pay

## 2023-04-23 ENCOUNTER — Ambulatory Visit: Payer: 59 | Admitting: Internal Medicine

## 2023-05-30 ENCOUNTER — Other Ambulatory Visit: Payer: Self-pay | Admitting: Nurse Practitioner

## 2023-05-30 DIAGNOSIS — J301 Allergic rhinitis due to pollen: Secondary | ICD-10-CM

## 2023-05-31 ENCOUNTER — Other Ambulatory Visit: Payer: Self-pay

## 2023-05-31 MED ORDER — FLUTICASONE PROPIONATE 50 MCG/ACT NA SUSP
2.0000 | Freq: Every day | NASAL | 2 refills | Status: DC
  Filled 2023-05-31: qty 16, 30d supply, fill #0
  Filled 2023-07-04: qty 16, 30d supply, fill #1
  Filled 2023-08-08: qty 16, 30d supply, fill #2

## 2023-06-01 ENCOUNTER — Other Ambulatory Visit: Payer: Self-pay

## 2023-06-01 ENCOUNTER — Other Ambulatory Visit (HOSPITAL_COMMUNITY): Payer: Self-pay

## 2023-07-05 ENCOUNTER — Other Ambulatory Visit: Payer: Self-pay

## 2023-07-05 ENCOUNTER — Encounter: Payer: 59 | Admitting: Nurse Practitioner

## 2023-07-07 ENCOUNTER — Encounter: Payer: 59 | Admitting: Nurse Practitioner

## 2023-07-08 ENCOUNTER — Telehealth: Payer: Self-pay

## 2023-07-08 NOTE — Telephone Encounter (Signed)
 Pt scheduled CPE on 08/19/2023 at 1:20pm and wanted to know if there was an earlier appt slot. I offered patient 08/25/2023 at 9:20am. She agreed due to having fasting labs.

## 2023-07-08 NOTE — Telephone Encounter (Signed)
 Copied from CRM [id number]. Topic: Appointments - Appointment Scheduling >> Jul 08, 2023  2:26 PM Isabelle Course C wrote: Patient/patient representative is calling to schedule an appointment. Patient also wants to know does she needs to fast for this appt.

## 2023-08-09 ENCOUNTER — Other Ambulatory Visit: Payer: Self-pay

## 2023-08-19 ENCOUNTER — Encounter: Payer: 59 | Admitting: Nurse Practitioner

## 2023-08-25 ENCOUNTER — Encounter: Payer: Self-pay | Admitting: Nurse Practitioner

## 2023-08-25 ENCOUNTER — Ambulatory Visit (INDEPENDENT_AMBULATORY_CARE_PROVIDER_SITE_OTHER): Payer: 59 | Admitting: Nurse Practitioner

## 2023-08-25 ENCOUNTER — Other Ambulatory Visit (HOSPITAL_COMMUNITY): Payer: Self-pay

## 2023-08-25 VITALS — BP 120/84 | HR 79 | Temp 98.2°F | Ht 65.0 in | Wt 147.0 lb

## 2023-08-25 DIAGNOSIS — Z13220 Encounter for screening for lipoid disorders: Secondary | ICD-10-CM

## 2023-08-25 DIAGNOSIS — Z136 Encounter for screening for cardiovascular disorders: Secondary | ICD-10-CM | POA: Diagnosis not present

## 2023-08-25 DIAGNOSIS — Z0000 Encounter for general adult medical examination without abnormal findings: Secondary | ICD-10-CM

## 2023-08-25 DIAGNOSIS — J301 Allergic rhinitis due to pollen: Secondary | ICD-10-CM

## 2023-08-25 DIAGNOSIS — M62838 Other muscle spasm: Secondary | ICD-10-CM | POA: Insufficient documentation

## 2023-08-25 LAB — COMPREHENSIVE METABOLIC PANEL WITH GFR
ALT: 7 U/L (ref 0–35)
AST: 14 U/L (ref 0–37)
Albumin: 4.6 g/dL (ref 3.5–5.2)
Alkaline Phosphatase: 55 U/L (ref 39–117)
BUN: 14 mg/dL (ref 6–23)
CO2: 25 meq/L (ref 19–32)
Calcium: 9.2 mg/dL (ref 8.4–10.5)
Chloride: 104 meq/L (ref 96–112)
Creatinine, Ser: 0.69 mg/dL (ref 0.40–1.20)
GFR: 100.01 mL/min (ref >60.00)
Glucose, Bld: 83 mg/dL (ref 70–99)
Potassium: 4.1 meq/L (ref 3.5–5.1)
Sodium: 138 meq/L (ref 135–145)
Total Bilirubin: 0.5 mg/dL (ref 0.2–1.2)
Total Protein: 7.1 g/dL (ref 6.0–8.3)

## 2023-08-25 LAB — LIPID PANEL
Cholesterol: 191 mg/dL (ref 0–200)
HDL: 79.5 mg/dL (ref >39.00)
LDL Cholesterol: 90 mg/dL (ref 0–99)
NonHDL: 111.42
Total CHOL/HDL Ratio: 2
Triglycerides: 106 mg/dL (ref 0.0–149.0)
VLDL: 21.2 mg/dL (ref 0.0–40.0)

## 2023-08-25 MED ORDER — FLUTICASONE PROPIONATE 50 MCG/ACT NA SUSP
2.0000 | Freq: Every day | NASAL | 1 refills | Status: DC
Start: 2023-08-25 — End: 2024-01-27
  Filled 2023-08-25 – 2023-09-13 (×2): qty 32, 60d supply, fill #0
  Filled 2023-12-03: qty 32, 60d supply, fill #1

## 2023-08-25 NOTE — Assessment & Plan Note (Signed)
 Right anterior neck muscl, behind clavicle-scalenes, chronic, intermittent, radiates to right shoulder and deltoid muscle, no weakness, no paresthesia. Minimal improvement with change of pillows and home stretch exercises Opted to start acupuncture sessions. Advised to consider outpatient PT.

## 2023-08-25 NOTE — Patient Instructions (Signed)
 Go to lab Maintain Heart healthy diet and daily exercise. Maintain current medications.  Preventive Care 48-52 Years Old, Female Preventive care refers to lifestyle choices and visits with your health care provider that can promote health and wellness. Preventive care visits are also called wellness exams. What can I expect for my preventive care visit? Counseling Your health care provider may ask you questions about your: Medical history, including: Past medical problems. Family medical history. Pregnancy history. Current health, including: Menstrual cycle. Method of birth control. Emotional well-being. Home life and relationship well-being. Sexual activity and sexual health. Lifestyle, including: Alcohol, nicotine or tobacco, and drug use. Access to firearms. Diet, exercise, and sleep habits. Work and work Astronomer. Sunscreen use. Safety issues such as seatbelt and bike helmet use. Physical exam Your health care provider will check your: Height and weight. These may be used to calculate your BMI (body mass index). BMI is a measurement that tells if you are at a healthy weight. Waist circumference. This measures the distance around your waistline. This measurement also tells if you are at a healthy weight and may help predict your risk of certain diseases, such as type 2 diabetes and high blood pressure. Heart rate and blood pressure. Body temperature. Skin for abnormal spots. What immunizations do I need?  Vaccines are usually given at various ages, according to a schedule. Your health care provider will recommend vaccines for you based on your age, medical history, and lifestyle or other factors, such as travel or where you work. What tests do I need? Screening Your health care provider may recommend screening tests for certain conditions. This may include: Lipid and cholesterol levels. Diabetes screening. This is done by checking your blood sugar (glucose) after you  have not eaten for a while (fasting). Pelvic exam and Pap test. Hepatitis B test. Hepatitis C test. HIV (human immunodeficiency virus) test. STI (sexually transmitted infection) testing, if you are at risk. Lung cancer screening. Colorectal cancer screening. Mammogram. Talk with your health care provider about when you should start having regular mammograms. This may depend on whether you have a family history of breast cancer. BRCA-related cancer screening. This may be done if you have a family history of breast, ovarian, tubal, or peritoneal cancers. Bone density scan. This is done to screen for osteoporosis. Talk with your health care provider about your test results, treatment options, and if necessary, the need for more tests. Follow these instructions at home: Eating and drinking  Eat a diet that includes fresh fruits and vegetables, whole grains, lean protein, and low-fat dairy products. Take vitamin and mineral supplements as recommended by your health care provider. Do not drink alcohol if: Your health care provider tells you not to drink. You are pregnant, may be pregnant, or are planning to become pregnant. If you drink alcohol: Limit how much you have to 0-1 drink a day. Know how much alcohol is in your drink. In the [place] one drink equals one 12 oz bottle of beer (355 mL), one 5 oz glass of wine (148 mL), or one 1 oz glass of hard liquor (44 mL). Lifestyle Brush your teeth every morning and night with fluoride toothpaste. Floss one time each day. Exercise for at least 30 minutes 5 or more days each week. Do not use any products that contain nicotine or tobacco. These products include cigarettes, chewing tobacco, and vaping devices, such as e-cigarettes. If you need help quitting, ask your health care provider. Do not use drugs. If you are  sexually active, practice safe sex. Use a condom or other form of protection to prevent STIs. If you do not wish to become pregnant, use  a form of birth control. If you plan to become pregnant, see your health care provider for a prepregnancy visit. Take aspirin only as told by your health care provider. Make sure that you understand how much to take and what form to take. Work with your health care provider to find out whether it is safe and beneficial for you to take aspirin daily. Find healthy ways to manage stress, such as: Meditation, yoga, or listening to music. Journaling. Talking to a trusted person. Spending time with friends and family. Minimize exposure to UV radiation to reduce your risk of skin cancer. Safety Always wear your seat belt while driving or riding in a vehicle. Do not drive: If you have been drinking alcohol. Do not ride with someone who has been drinking. When you are tired or distracted. While texting. If you have been using any mind-altering substances or drugs. Wear a helmet and other protective equipment during sports activities. If you have firearms in your house, make sure you follow all gun safety procedures. Seek help if you have been physically or sexually abused. What's next? Visit your health care provider once a year for an annual wellness visit. Ask your health care provider how often you should have your eyes and teeth checked. Stay up to date on all vaccines. This information is not intended to replace advice given to you by your health care provider. Make sure you discuss any questions you have with your health care provider. Document Revised: 10/30/2020 Document Reviewed: 10/30/2020 Elsevier Patient Education  2024 [employer].

## 2023-08-25 NOTE — Assessment & Plan Note (Addendum)
 Had eval by ENT Positional-worse on left side and supine No apnea, no Am headache, no daytime fatigue Aggravated by nasal spray-azelastine and flonase Consider referral to dentist for mouth guard fitting

## 2023-08-25 NOTE — Progress Notes (Signed)
 Complete physical exam  Patient: Christy [name]   DOB: August 13, 1970   53 y.o. Female  MRN: [id number] Visit Date: 08/25/2023  Subjective:    Chief Complaint  Patient presents with   Annual Exam    Fasting    Christy [name] is a 53 y.o. female who presents today for a complete physical exam. She reports consuming a low fat and low sodium diet. Home exercise routine includes calisthenics, stretching, and rowing machine 4-5x/week. She generally feels well. She reports sleeping well. She does have additional problems to discuss today.  Vision:Yes Dental:Yes STD Screen:No  BP Readings from Last 3 Encounters:  08/25/23 120/84  01/11/23 120/70  10/16/22 132/82   Wt Readings from Last 3 Encounters:  08/25/23 147 lb (66.7 kg)  01/11/23 144 lb 3.2 oz (65.4 kg)  12/01/22 143 lb (64.9 kg)    Most recent fall risk assessment:    08/25/2023    9:20 AM  Fall Risk   Falls in the past year? 0  Number falls in past yr: 0  Injury with Fall? 0  Risk for fall due to : No Fall Risks  Follow up Falls prevention discussed   Depression screen:Yes - No Depression Most recent depression screenings:    08/25/2023    9:20 AM 03/31/2023    9:53 AM  PHQ 2/9 Scores  PHQ - 2 Score 0 1  PHQ- 9 Score 1 1    HPI  Snoring Had eval by ENT Positional-worse on left side and supine No apnea, no Am headache, no daytime fatigue Aggravated by nasal spray-azelastine and flonase Consider referral to dentist for mouth guard fitting  Neck muscle spasm Right anterior neck muscl, behind clavicle-scalenes, chronic, intermittent, radiates to right shoulder and deltoid muscle, no weakness, no paresthesia. Minimal improvement with change of pillows and home stretch exercises Opted to start acupuncture sessions. Advised to consider outpatient PT.   Past Medical History:  Diagnosis Date   Allergy    seasonal   Thyroid cancer (HCC) 2004   s/p thyroidectomy    Past Surgical History:  Procedure  Laterality Date   CYST EXCISION Right    "on rib cage"   THYROIDECTOMY  05/18/2002   TONSILLECTOMY     age 69   Social History   Socioeconomic History   Marital status: Married    Spouse name: Not on file   Number of children: 0   Years of education: Not on file   Highest education level: Master's degree (e.g., MA, MS, MEng, MEd, MSW, MBA)  Occupational History   Occupation: IT    Employer: [employer]  Tobacco Use   Smoking status: Never    Passive exposure: Never   Smokeless tobacco: Never  Vaping Use   Vaping status: Never Used  Substance and Sexual Activity   Alcohol use: Yes    Alcohol/week: 3.0 standard drinks of alcohol    Types: 1 Glasses of wine, 1 Cans of beer, 1 Shots of liquor per week   Drug use: Never   Sexual activity: Yes    Birth control/protection: Pill  Other Topics Concern   Not on file  Social History Narrative   Not on file   Social Drivers of Health   Financial Resource Strain: Low Risk  (08/24/2023)   Overall Financial Resource Strain (CARDIA)    Difficulty of Paying Living Expenses: Not hard at all  Food Insecurity: No Food Insecurity (08/24/2023)   Hunger Vital Sign    Worried About  Running Out of Food in the Last Year: Never true    Ran Out of Food in the Last Year: Never true  Transportation Needs: No Transportation Needs (08/24/2023)   PRAPARE - Administrator, Civil Service (Medical): No    Lack of Transportation (Non-Medical): No  Physical Activity: Insufficiently Active (08/24/2023)   Exercise Vital Sign    Days of Exercise per Week: 4 days    Minutes of Exercise per Session: 30 min  Stress: Stress Concern Present (08/24/2023)   [employer] of Occupational Health - Occupational Stress Questionnaire    Feeling of Stress : To some extent  Social Connections: Moderately Isolated (08/24/2023)   Social Connection and Isolation Panel [NHANES]    Frequency of Communication with Friends and Family: More than three times a week     Frequency of Social Gatherings with Friends and Family: Once a week    Attends Religious Services: Never    Database administrator or Organizations: No    Attends Engineer, structural: Not on file    Marital Status: Married  Catering manager Violence: Not on file   Family Status  Relation Name Status   Mother Theresa Duty Alive   Father Byhalia Alive   Sister  Alive   Sister [place] (Not Specified)   Brother  Alive   Oceanographer  (Not Specified)   Pat Aunt Barnett Hatter (Not Specified)   MGM  Deceased   MGF I.S. Deceased   PGM  Deceased   PGF Ferrell Deceased   Neg Hx  (Not Specified)  No partnership data on file   Family History  Problem Relation Age of Onset   Arthritis Mother    Colon polyps Mother    Depression Mother    Arrhythmia Father        A-fib and need for Pacemaker   Colon polyps Father    Arthritis Father    Diverticulitis Sister    Depression Sister    Colon cancer Paternal Aunt    Depression Paternal Aunt    Obesity Paternal Aunt    Dementia Maternal Grandmother    Heart disease Maternal Grandfather    Heart disease Paternal Grandfather    Crohn's disease Neg Hx    Esophageal cancer Neg Hx    Rectal cancer Neg Hx    Stomach cancer Neg Hx    Allergies  Allergen Reactions   Neosporin [Neomycin-Bacitracin Zn-Polymyx]    Thyrogen [Thyrotropin] Other (See Comments)    Thyrogen allergy >> needs pretreatment with steroids    Patient Care Team: Neilani Duffee, [name], NP as PCP - General (Internal Medicine)   Medications: Outpatient Medications Prior to Visit  Medication Sig   cetirizine (ZYRTEC) 10 MG tablet Take 1 tablet (10 mg total) by mouth daily.   diphenhydrAMINE (BENADRYL) 25 mg capsule as needed.   ipratropium (ATROVENT) 0.06 % nasal spray Place 2 sprays into both nostrils 4 (four) times daily. (Patient taking differently: Place 2 sprays into both nostrils as needed.)   NATAZIA 3/2-2/2-3/1 MG tablet Take 1 tablet by mouth daily.   SYNTHROID  125 MCG tablet TAKE 1 TABLET BY MOUTH EVERY MORNING BEFORE BREAKFAST   [DISCONTINUED] fluticasone (FLONASE) 50 MCG/ACT nasal spray Place 2 sprays into both nostrils daily.   ALPRAZolam (XANAX) 0.25 MG tablet Take 1 tablet (0.25 mg total) by mouth 3 (three) times daily as needed for anxiety.   azelastine (ASTELIN) 0.1 % nasal spray Place 2 sprays into both nostrils 2 (  two) times daily. Use in each nostril as directed (Patient taking differently: Place 2 sprays into both nostrils as needed. Use in each nostril as directed)   No facility-administered medications prior to visit.    Review of Systems  Constitutional:  Negative for activity change, appetite change and unexpected weight change.  Respiratory: Negative.    Cardiovascular: Negative.   Gastrointestinal: Negative.   Endocrine: Negative for cold intolerance and heat intolerance.  Genitourinary: Negative.   Musculoskeletal: Negative.   Skin: Negative.   Neurological: Negative.   Hematological: Negative.   Psychiatric/Behavioral:  Negative for behavioral problems, decreased concentration, dysphoric mood, hallucinations, self-injury, sleep disturbance and suicidal ideas. The patient is not nervous/anxious.        Objective:  BP 120/84   Pulse 79   Temp 98.2 F (36.8 C) (Temporal)   Ht 5\' 5"  (1.651 m)   Wt 147 lb (66.7 kg)   SpO2 100%   BMI 24.46 kg/m     Physical Exam Vitals and nursing note reviewed.  Constitutional:      General: She is not in acute distress. HENT:     Right Ear: Tympanic membrane, ear canal and external ear normal.     Left Ear: Tympanic membrane, ear canal and external ear normal.     Nose: Nose normal.  Eyes:     Extraocular Movements: Extraocular movements intact.     Conjunctiva/sclera: Conjunctivae normal.     Pupils: Pupils are equal, round, and reactive to light.  Neck:     Thyroid: No thyroid mass, thyromegaly or thyroid tenderness.  Cardiovascular:     Rate and Rhythm: Normal rate and  regular rhythm.     Pulses: Normal pulses.     Heart sounds: Normal heart sounds.  Pulmonary:     Effort: Pulmonary effort is normal.     Breath sounds: Normal breath sounds.  Abdominal:     General: Bowel sounds are normal.     Palpations: Abdomen is soft.  Musculoskeletal:        General: Normal range of motion.     Cervical back: Normal range of motion and neck supple.     Right lower leg: No edema.     Left lower leg: No edema.  Lymphadenopathy:     Cervical: No cervical adenopathy.  Skin:    General: Skin is warm and dry.  Neurological:     Mental Status: She is alert and oriented to person, place, and time.     Cranial Nerves: No cranial nerve deficit.  Psychiatric:        Mood and Affect: Mood normal.        Behavior: Behavior normal.        Thought Content: Thought content normal.     No results found for any visits on 08/25/23.    Assessment & Plan:    Routine Health Maintenance and Physical Exam  Immunization History  Administered Date(s) Administered   Influenza Whole 02/15/2013   Influenza, Seasonal, Injecte, Preservative Fre 02/01/2023   Influenza,inj,Quad PF,6+ Mos 01/17/2018, 02/26/2022   Influenza-Unspecified 02/14/2014, 01/31/2015, 02/18/2017, 02/25/2021   PFIZER(Purple Top)SARS-COV-2 Vaccination 05/27/2019, 06/16/2019, 03/08/2020   Pfizer Covid-19 Vaccine Bivalent Booster 32yrs & up 03/03/2021   Pfizer(Comirnaty)Fall Seasonal Vaccine 12 years and older 03/09/2022, 02/01/2023   Td 07/01/2022   Tdap 06/17/2012   Zoster Recombinant(Shingrix) 07/01/2022, 09/03/2022   Health Maintenance  Topic Date Due   COVID-19 Vaccine (7 - Pfizer risk 2024-25 season) 02/24/2024 (Originally 08/01/2023)   Hepatitis C  Screening  08/24/2024 (Originally 07/18/1989)   HIV Screening  08/24/2024 (Originally 07/19/1986)   INFLUENZA VACCINE  12/17/2023   MAMMOGRAM  12/07/2024   Colonoscopy  10/09/2026   Cervical Cancer Screening (HPV/Pap Cotest)  07/04/2027   DTaP/Tdap/Td (3 -  Td or Tdap) 07/01/2032   Zoster Vaccines- Shingrix  Completed   HPV VACCINES  Aged Out   Discussed health benefits of physical activity, and encouraged her to engage in regular exercise appropriate for her age and condition.  Problem List Items Addressed This Visit     Chronic seasonal allergic rhinitis due to pollen   Relevant Medications   fluticasone (FLONASE) 50 MCG/ACT nasal spray   Other Visit Diagnoses       Preventative health care    -  Primary   Relevant Orders   Lipid panel   Comprehensive metabolic panel with GFR     Encounter for lipid screening for cardiovascular disease       Relevant Orders   Lipid panel      Return in about 1 year (around 08/24/2024) for CPE (fasting).     Alysia Penna, NP

## 2023-08-28 ENCOUNTER — Other Ambulatory Visit (HOSPITAL_BASED_OUTPATIENT_CLINIC_OR_DEPARTMENT_OTHER): Payer: Self-pay | Admitting: Medical

## 2023-09-13 ENCOUNTER — Other Ambulatory Visit (HOSPITAL_BASED_OUTPATIENT_CLINIC_OR_DEPARTMENT_OTHER): Payer: Self-pay | Admitting: Medical

## 2023-09-13 ENCOUNTER — Other Ambulatory Visit (HOSPITAL_BASED_OUTPATIENT_CLINIC_OR_DEPARTMENT_OTHER): Payer: Self-pay | Admitting: *Deleted

## 2023-09-13 ENCOUNTER — Other Ambulatory Visit (HOSPITAL_COMMUNITY): Payer: Self-pay

## 2023-09-13 MED ORDER — NATAZIA 3/2-2/2-3/1 MG PO TABS
1.0000 | ORAL_TABLET | Freq: Every day | ORAL | 0 refills | Status: DC
  Filled 2023-09-13: qty 84, 84d supply, fill #0

## 2023-09-14 ENCOUNTER — Other Ambulatory Visit: Payer: Self-pay

## 2023-09-14 ENCOUNTER — Other Ambulatory Visit (HOSPITAL_COMMUNITY): Payer: Self-pay

## 2023-09-20 ENCOUNTER — Encounter (HOSPITAL_BASED_OUTPATIENT_CLINIC_OR_DEPARTMENT_OTHER): Payer: Self-pay | Admitting: Certified Nurse Midwife

## 2023-09-20 ENCOUNTER — Ambulatory Visit (HOSPITAL_BASED_OUTPATIENT_CLINIC_OR_DEPARTMENT_OTHER): Admitting: Certified Nurse Midwife

## 2023-09-20 VITALS — BP 133/87 | HR 92 | Ht 64.75 in | Wt 147.6 lb

## 2023-09-20 DIAGNOSIS — R87610 Atypical squamous cells of undetermined significance on cytologic smear of cervix (ASC-US): Secondary | ICD-10-CM | POA: Insufficient documentation

## 2023-09-20 DIAGNOSIS — Z01411 Encounter for gynecological examination (general) (routine) with abnormal findings: Secondary | ICD-10-CM | POA: Diagnosis not present

## 2023-09-20 DIAGNOSIS — D126 Benign neoplasm of colon, unspecified: Secondary | ICD-10-CM | POA: Diagnosis not present

## 2023-09-20 DIAGNOSIS — Z01419 Encounter for gynecological examination (general) (routine) without abnormal findings: Secondary | ICD-10-CM

## 2023-09-20 MED ORDER — NATAZIA 3/2-2/2-3/1 MG PO TABS
1.0000 | ORAL_TABLET | Freq: Every day | ORAL | 6 refills | Status: AC
  Filled 2023-09-20: qty 112, 112d supply, fill #0
  Filled 2023-12-03: qty 84, 84d supply, fill #0
  Filled 2024-02-25: qty 84, 84d supply, fill #1
  Filled 2024-05-19: qty 84, 84d supply, fill #2

## 2023-09-20 NOTE — Progress Notes (Signed)
 53 y.o. G59P0020 Married White or Caucasian female here for annual exam. She lives with her supportive spouse. Has a 13yo [place]. She denies hot flashes or night sweats. Pt eats a healthy well balanced diet including Calcium/Dairy/Yogurt/Milk.   Patient's last menstrual period was 08/23/2023.          Sexually active: Yes.    The current method of family planning is vasectomy.     Exercising: Yes.    Includes weight bearing exercise Smoker:  no  Health Maintenance: Pap:  2024 ASCUS with HR HPV Negative History of abnormal Pap:  no MMG:  UTD 12/08/22 (Dense) Colonoscopy:  UTD 2023, Repeat planned for 2028, +polyp BMD:   n/a Screening Labs: PCP   reports that she has never smoked. She has never been exposed to tobacco smoke. She has never used smokeless tobacco. She reports current alcohol use of about 3.0 standard drinks of alcohol per week. She reports that she does not use drugs.  Past Medical History:  Diagnosis Date   Allergy     seasonal   Thyroid  cancer (HCC) 2004   s/p thyroidectomy     Past Surgical History:  Procedure Laterality Date   CYST EXCISION Right    "on rib cage"   THYROIDECTOMY  05/18/2002   TONSILLECTOMY     age 85    Current Outpatient Medications  Medication Sig Dispense Refill   ALPRAZolam  (XANAX ) 0.25 MG tablet Take 1 tablet (0.25 mg total) by mouth 3 (three) times daily as needed for anxiety. 21 tablet 0   azelastine  (ASTELIN ) 0.1 % nasal spray Place 2 sprays into both nostrils 2 (two) times daily. Use in each nostril as directed (Patient taking differently: Place 2 sprays into both nostrils as needed. Use in each nostril as directed) 30 mL 12   cetirizine  (ZYRTEC ) 10 MG tablet Take 1 tablet (10 mg total) by mouth daily. 30 tablet 11   diphenhydrAMINE (BENADRYL) 25 mg capsule as needed.     fluticasone  (FLONASE ) 50 MCG/ACT nasal spray Place 2 sprays into both nostrils daily. 32 g 1   ipratropium (ATROVENT ) 0.06 % nasal spray Place 2 sprays into  both nostrils 4 (four) times daily. (Patient taking differently: Place 2 sprays into both nostrils as needed.) 15 mL 12   SYNTHROID  125 MCG tablet TAKE 1 TABLET BY MOUTH EVERY MORNING BEFORE BREAKFAST 90 tablet 3   NATAZIA  3/2-2/2-3/1 MG tablet Take 1 tablet by mouth daily. 90 tablet 6   No current facility-administered medications for this visit.    Family History  Problem Relation Age of Onset   Arthritis Mother    Colon polyps Mother    Depression Mother    Arrhythmia Father        A-fib and need for Pacemaker   Colon polyps Father    Arthritis Father    Diverticulitis Sister    Depression Sister    Colon cancer Paternal Aunt    Depression Paternal Aunt    Obesity Paternal Aunt    Dementia Maternal Grandmother    Heart disease Maternal Grandfather    Heart disease Paternal Grandfather    Crohn's disease Neg Hx    Esophageal cancer Neg Hx    Rectal cancer Neg Hx    Stomach cancer Neg Hx     ROS: Constitutional: negative Genitourinary:negative  Exam:   BP 133/87 (BP Location: Left Arm, Patient Position: Sitting, Cuff Size: Large)   Pulse 92   Ht 5' 4.75" (1.645 m)   Wt  147 lb 9.6 oz (67 kg)   LMP 08/23/2023 Comment: Ran out of birth control pills. Has not had a cycle  BMI 24.75 kg/m   Height: 5' 4.75" (164.5 cm)  General appearance: alert, cooperative and appears stated age Head: Normocephalic, without obvious abnormality, atraumatic Breasts: normal appearance, no masses or tenderness, Inspection negative, No nipple retraction or dimpling, No nipple discharge or bleeding, No axillary or supraclavicular adenopathy, Normal to palpation without dominant masses Heart: regular rate and rhythm Abdomen: soft, non-tender; bowel sounds normal; no masses,  no organomegaly Extremities: extremities normal, atraumatic, no cyanosis or edema Skin: Skin color, texture, turgor normal. No rashes or lesions Lymph nodes: Cervical, supraclavicular, and axillary nodes normal. No  abnormal inguinal nodes palpated Neurologic: Grossly normal   Pelvic: External genitalia:  no lesions              Urethra:  normal appearing urethra with no masses, tenderness or lesions              Bartholins and Skenes: normal                 Vagina: normal appearing vagina with normal color and no discharge, no lesions              Cervix: no cervical motion tenderness, no lesions, and nulliparous appearance              Pap taken: No. Bimanual Exam:  Uterus:  normal size, contour, position, consistency, mobility, non-tender              Adnexa: no mass, fullness, tenderness              Anus:  normal sphincter tone, no lesions  Chaperone not available  Assessment/Plan:  1. Encounter for annual routine gynecological examination - continue annual screening mammograms and breast self awareness - May continue Natazia  COC   2. Atypical squamous cells of undetermined significance (ASCUS) on Papanicolaou smear of cervix (Primary) - High Risk HPV Negative, next pap due in 2026  3. Sessile serrated polyp of colon - Pt plans follow-up Colonoscopy 2028 as directed  RTO 1 year for annual gyn exam and prn if issues arise. Yolanda Hence

## 2023-09-21 ENCOUNTER — Other Ambulatory Visit (HOSPITAL_COMMUNITY): Payer: Self-pay

## 2023-11-09 DIAGNOSIS — L814 Other melanin hyperpigmentation: Secondary | ICD-10-CM | POA: Diagnosis not present

## 2023-11-09 DIAGNOSIS — D225 Melanocytic nevi of trunk: Secondary | ICD-10-CM | POA: Diagnosis not present

## 2023-11-09 DIAGNOSIS — L821 Other seborrheic keratosis: Secondary | ICD-10-CM | POA: Diagnosis not present

## 2023-11-24 ENCOUNTER — Other Ambulatory Visit: Payer: Self-pay | Admitting: Internal Medicine

## 2023-12-03 ENCOUNTER — Other Ambulatory Visit: Payer: Self-pay

## 2024-01-11 ENCOUNTER — Ambulatory Visit (INDEPENDENT_AMBULATORY_CARE_PROVIDER_SITE_OTHER): Payer: 59 | Admitting: Internal Medicine

## 2024-01-11 ENCOUNTER — Encounter: Payer: Self-pay | Admitting: Internal Medicine

## 2024-01-11 VITALS — BP 120/70 | HR 88 | Ht 64.75 in | Wt 148.2 lb

## 2024-01-11 DIAGNOSIS — C73 Malignant neoplasm of thyroid gland: Secondary | ICD-10-CM | POA: Diagnosis not present

## 2024-01-11 DIAGNOSIS — E032 Hypothyroidism due to medicaments and other exogenous substances: Secondary | ICD-10-CM | POA: Diagnosis not present

## 2024-01-11 NOTE — Progress Notes (Signed)
 Patient ID: Christy [name], female   DOB: [birth year], 53 y.o.   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 She was seen by Dr Tommas in the past.  Last visit with me 1 year ago.  Interim history: She feels well at today's visit, without complaints.  She had snoring and was diagnosed with reflux before last visit and was on Pepcid .  However, not taking this anymore. She did start biotin since last visit but she remembered to stop this 2 weeks ago.   I reviewed patient's thyroid  cancer history: 2002: FNA thyroid  nodule: benign 08/29/2002: thyroidectomy - minimally invasive Follicular carcinoma 5.5 x 4 x 4 cm with evidence of focal vascular invasion 2004: RAI tx 100 mCi 03/13/2006 Thyrogen  stimulated WBS negative 03/19/2007 Thyrogen  stimulated WBS negative 04/03/2007 ultrasound neck negative for cancer recurrence 04/21/2007 PET scan negative for metastases 09/23/2007 Thyrogen  stimulated WBS negative 08/16/2008: per Dr. Becki note, stimulated Tg was 2.9 08/10/2008 Thyrogen  stimulated WBS negative 10/15/2009 ultrasound neck with 3 abnormal cervical lymph nodes 11/08/2009 FNA of one of the lymph node that was found to be without fatty hilum: negative for metastases - she had a traumatic experience then and would not want to repeat this 09/26/2010 Thyrogen  stimulated WBS negative except uptake in colon 10/07/2012 Thyrogen  stimulated WBS negative  ! Patient has a Thyrogen  allergy  (pain and redness at the inj site) >> needs pretreatment with steroids 10/02/2013: Tg 0.2, ATA <1 10/16/2013: Neck U/S:  Small area of irregular tissue in the left thyroid  bed measures roughly 1 cm in length and 0.5 cm in width. This is very ill-defined appearing and may represent scar tissue. Thyroid  carcinoma recurrence cannot be completely excluded. Prior whole body nuclear medicine study was  negative for any thyroid  activity in the neck. No abnormal tissue is identified in the right thyroid  bed. No abnormal lymph nodes are identified. 09/05/2014: CT neck and chest: 1. Negative neck CT status post thyroidectomy. No soft tissue mass or lymphadenopathy corresponding to the right supraclavicular palpable area of concern, or elsewhere in the neck. 2. Negative chest CT.  No acute or metastatic process identified. 03/06/2020: Neck U/S: No suspicious masses  Also: 07/14/2006: Tg 1, ATA 3.6 03/2007: Tg <0.5, Stimulated Tg 2.7 12/2007: Tg <0.5, TSH 0.1 2013: Tg <0.5, ATA <20 09/16/2012: TSH 0.644, Tg 0.1, ATA <1 10/07/2012: Tg 2.0, ATA <1 06/28/2013: TSH 1.1  12/12/2014: TSH 1.45, 12/25/2014: ATA <1, Tg(RIA) <2.0 ...  Most recent labs: Lab Results  Component Value Date   TSH 0.56 04/05/2023   TSH 0.33 (L) 01/11/2023   TSH 0.39 01/08/2022   TSH 1.10 01/07/2021   TSH 1.69 07/29/2020   TSH 7.51 (H) 05/06/2020   TSH 3.26 01/02/2020   TSH 1.75 12/09/2018   TSH 0.67 12/10/2017   TSH 1.84 08/30/2017   FREET4 1.31 04/05/2023   FREET4 1.46 01/11/2023   FREET4 1.51 01/08/2022   FREET4 1.22 01/07/2021   FREET4 1.24 07/29/2020   FREET4 1.25 05/06/2020   FREET4 1.19 01/02/2020   FREET4 1.31 12/09/2018   FREET4 1.34 12/10/2017   FREET4 1.28 04/23/2017   Lab Results  Component Value Date   THYROGLB 0.1 (L) 01/11/2023   THYROGLB <0.1 (L) 01/08/2022   THYROGLB 0.1 (L) 01/07/2021   THYROGLB 0.2 (L) 05/06/2020   THYROGLB 0.2 (L) 01/02/2020   THYROGLB 0.1 (L) 12/09/2018   THYROGLB 0.1 (L) 12/10/2017   THYROGLB <  0.1 (L) 12/11/2016   THYROGLB 0.2 (L) 12/12/2015   THYROGLB <2.0 12/25/2014   THGAB <1 01/11/2023   THGAB <1 01/08/2022   THGAB <1 01/07/2021   THGAB <1 05/06/2020   THGAB <1 01/02/2020   THGAB <1 12/09/2018   THGAB <1 12/10/2017   THGAB <1.0 12/11/2016   THGAB <1.0 12/12/2015   THGAB <1.0 12/25/2014   Pt is on Synthroid  125 d.a.w. mcg daily, taken: - in  am - fasting - at least 30 min from b'fast - no Ca, Fe, was dx'ed with silent GERD >> was on Pepcid  >> stopped - off MVIs - on Biotin - but off for 2 weeks now in preparation for labs  Pt denies: - feeling nodules in neck - hoarseness - dysphagia - choking  She had a low vit D per ObGyn >> started vitamin D.   ROS: + see HPI  I reviewed pt's medications, allergies, PMH, social hx, family hx, and changes were documented in the history of present illness. Otherwise, unchanged from my initial visit note.  Past Medical History:  Diagnosis Date   Allergy     seasonal   Thyroid  cancer (HCC) 2004   s/p thyroidectomy    History   Social History   Marital Status: Married    Spouse Name: N/A    Number of Children: 2   Occupational History   Systems analyst - [employer]   Social History Main Topics   Smoking status: Never Smoker    Smokeless tobacco: No   Alcohol Use: Occasional: wine, beer   Drug Use: No  Exercise: walk/run - daily   Current Outpatient Medications on File Prior to Visit  Medication Sig Dispense Refill   ALPRAZolam  (XANAX ) 0.25 MG tablet Take 1 tablet (0.25 mg total) by mouth 3 (three) times daily as needed for anxiety. 21 tablet 0   azelastine  (ASTELIN ) 0.1 % nasal spray Place 2 sprays into both nostrils 2 (two) times daily. Use in each nostril as directed (Patient taking differently: Place 2 sprays into both nostrils as needed. Use in each nostril as directed) 30 mL 12   cetirizine  (ZYRTEC ) 10 MG tablet Take 1 tablet (10 mg total) by mouth daily. 30 tablet 11   diphenhydrAMINE (BENADRYL) 25 mg capsule as needed.     fluticasone  (FLONASE ) 50 MCG/ACT nasal spray Place 2 sprays into both nostrils daily. 32 g 1   ipratropium (ATROVENT ) 0.06 % nasal spray Place 2 sprays into both nostrils 4 (four) times daily. (Patient taking differently: Place 2 sprays into both nostrils as needed.) 15 mL 12   NATAZIA  3/2-2/2-3/1 MG tablet Take 1 tablet by mouth daily. 90  tablet 6   SYNTHROID  125 MCG tablet TAKE 1 TABLET DAILY IN THE MORNING BEFORE BREAKFAST 90 tablet 0   No current facility-administered medications on file prior to visit.   Allergies  Allergen Reactions   Neosporin [Neomycin-Bacitracin Zn-Polymyx]    Thyrogen  [Thyrotropin] Other (See Comments)    Thyrogen  allergy  >> needs pretreatment with steroids   FH: H/o heart ds. in father  PE: BP 120/70   Pulse 88   Ht 5' 4.75 (1.645 m)   Wt 148 lb 3.2 oz (67.2 kg)   SpO2 98%   BMI 24.85 kg/m  Wt Readings from Last 3 Encounters:  01/11/24 148 lb 3.2 oz (67.2 kg)  09/20/23 147 lb 9.6 oz (67 kg)  08/25/23 147 lb (66.7 kg)   Constitutional: normal weight, in NAD Eyes: no exophthalmos ENT: no masses palpated  in neck, no cervical lymphadenopathy Cardiovascular: RRR, No MRG Respiratory: CTA B Musculoskeletal: no deformities Skin:  no rashes Neurological: no tremor with outstretched hands  ASSESSMENT: 1. Follicular thyroid  cancer  2. Postsurgical Hypothyroidism  PLAN:  1. Follicular ThyCa - In remission - Her antithyroglobulin antibodies are negative recently, but they have been positive in 2008 -Her thyroglobulin levels have been fluctuating, from undetectable to barely detectable, with the highest being 0.2 in 2021, but latest level being 0.1 at last visit -a neck ultrasound report from 2015 showed scar tissue versus thyroid  remnant, but this has been present since her surgery, per records from Dr. Balan. This was the reason for many whole-body scans and PET scans that she had in the past.  She had a chest and neck CT scan in 2016, with contrast, which was negative for metastasis or recurrence.  Another neck ultrasound from 02/2020 was negative for any suspicious masses - No neck compression symptoms or masses felt on palpation of her neck today - At today's visit, we will repeat her thyroglobulin and ATA antibodies - If the thyroglobulin starts rising, we will need to check a  thyroid  U/S and possibly also a PET scan.  If the thyroglobulin level remains low, plan to repeat a thyroid  U/S at next visit, 5 years from the previous.  2. Postsurgical hypothyroidism, on brand-name Synthroid  therapy - at last visit, TSH was slightly low, at 0.33.  I did not suggest a change in Synthroid  dose at that time but repeat the labs in 3 months. - latest thyroid  labs reviewed with pt. >> normal at last check on the same dose of Synthroid : Lab Results  Component Value Date   TSH 0.56 04/05/2023  - she continues on Synthroid  d.a.w. 125 mcg daily - pt feels good on this dose. - we discussed about taking the thyroid  hormone every day, with water, >30 minutes before breakfast, separated by >4 hours from acid reflux medications, calcium, iron, multivitamins. Pt. is taking it correctly. - will check thyroid  tests today: TSH and fT4 - If labs are abnormal, she will need to return for repeat TFTs in 1.5 months  Needs refills - to [facility]  Orders Placed This Encounter  Procedures   TSH   T4, free   Thyroglobulin antibody   Thyroglobulin Level   Lela Fendt, MD PhD [facility] Endocrinology

## 2024-01-11 NOTE — Patient Instructions (Signed)
Please stop at the lab.  Please continue Synthroid 125 mcg daily.  Take the thyroid hormone every day, with water, at least 30 minutes before breakfast, separated by at least 4 hours from: - acid reflux medications - calcium - iron - multivitamins  Please return in 1 year.

## 2024-01-13 LAB — THYROGLOBULIN LEVEL: Thyroglobulin: 0.1 ng/mL — ABNORMAL LOW

## 2024-01-13 LAB — T4, FREE: Free T4: 1.9 ng/dL — ABNORMAL HIGH (ref 0.8–1.8)

## 2024-01-13 LAB — THYROGLOBULIN ANTIBODY: Thyroglobulin Ab: <1 [IU]/mL (ref <=1)

## 2024-01-13 LAB — TSH: TSH: 1.28 m[IU]/L

## 2024-01-14 ENCOUNTER — Ambulatory Visit: Payer: Self-pay | Admitting: Internal Medicine

## 2024-01-14 MED ORDER — SYNTHROID 125 MCG PO TABS: 125.0000 ug | ORAL_TABLET | Freq: Every day | ORAL | 3 refills | Status: AC

## 2024-01-14 NOTE — Addendum Note (Signed)
 Addended by: TRIXIE FILE on: 01/14/2024 08:31 AM   Modules accepted: Orders

## 2024-01-27 ENCOUNTER — Other Ambulatory Visit: Payer: Self-pay | Admitting: Nurse Practitioner

## 2024-01-27 DIAGNOSIS — J301 Allergic rhinitis due to pollen: Secondary | ICD-10-CM

## 2024-01-28 ENCOUNTER — Other Ambulatory Visit (HOSPITAL_COMMUNITY): Payer: Self-pay

## 2024-01-28 MED ORDER — FLUTICASONE PROPIONATE 50 MCG/ACT NA SUSP
2.0000 | Freq: Every day | NASAL | 1 refills | Status: DC
  Filled 2024-01-28 (×2): qty 32, 60d supply, fill #0
  Filled 2024-03-27: qty 32, 60d supply, fill #1

## 2024-02-25 ENCOUNTER — Other Ambulatory Visit: Payer: Self-pay

## 2024-02-25 ENCOUNTER — Other Ambulatory Visit (HOSPITAL_BASED_OUTPATIENT_CLINIC_OR_DEPARTMENT_OTHER): Payer: Self-pay

## 2024-02-25 MED ORDER — COMIRNATY 30 MCG/0.3ML IM SUSY
0.3000 mL | PREFILLED_SYRINGE | Freq: Once | INTRAMUSCULAR | 0 refills | Status: AC
  Filled 2024-02-25: qty 0.3, 1d supply, fill #0

## 2024-02-25 MED ORDER — FLUZONE 0.5 ML IM SUSY
0.5000 mL | PREFILLED_SYRINGE | Freq: Once | INTRAMUSCULAR | 0 refills | Status: AC
  Filled 2024-02-25: qty 0.5, 1d supply, fill #0

## 2024-03-17 ENCOUNTER — Other Ambulatory Visit: Payer: Self-pay | Admitting: Medical Genetics

## 2024-03-17 DIAGNOSIS — Z006 Encounter for examination for normal comparison and control in clinical research program: Secondary | ICD-10-CM

## 2024-03-27 ENCOUNTER — Other Ambulatory Visit (HOSPITAL_COMMUNITY): Payer: Self-pay

## 2024-05-19 ENCOUNTER — Other Ambulatory Visit: Payer: Self-pay

## 2024-05-19 ENCOUNTER — Other Ambulatory Visit (HOSPITAL_COMMUNITY): Payer: Self-pay

## 2024-05-26 ENCOUNTER — Other Ambulatory Visit: Payer: Self-pay | Admitting: Nurse Practitioner

## 2024-05-26 DIAGNOSIS — J301 Allergic rhinitis due to pollen: Secondary | ICD-10-CM

## 2024-05-26 MED ORDER — FLUTICASONE PROPIONATE 50 MCG/ACT NA SUSP
2.0000 | Freq: Every day | NASAL | 1 refills | Status: AC
  Filled 2024-05-26: qty 32, 60d supply, fill #0

## 2024-05-29 ENCOUNTER — Other Ambulatory Visit (HOSPITAL_COMMUNITY): Payer: Self-pay

## 2024-05-29 ENCOUNTER — Other Ambulatory Visit: Payer: Self-pay

## 2024-09-27 ENCOUNTER — Ambulatory Visit (HOSPITAL_BASED_OUTPATIENT_CLINIC_OR_DEPARTMENT_OTHER): Admitting: Certified Nurse Midwife

## 2025-01-10 ENCOUNTER — Ambulatory Visit: Admitting: Internal Medicine
